# Patient Record
Sex: Male | Born: 1950 | Race: White | Hispanic: No | Marital: Married | Smoking: Former smoker
Health system: Southern US, Community
[De-identification: ages and names within clinical notes are randomized; demographics above are authoritative.]

## PROBLEM LIST (undated history)

## (undated) DIAGNOSIS — M1990 Unspecified osteoarthritis, unspecified site: Secondary | ICD-10-CM

## (undated) DIAGNOSIS — H269 Unspecified cataract: Secondary | ICD-10-CM

## (undated) DIAGNOSIS — K219 Gastro-esophageal reflux disease without esophagitis: Secondary | ICD-10-CM

## (undated) DIAGNOSIS — Z860100 Personal history of colon polyps, unspecified: Secondary | ICD-10-CM

## (undated) DIAGNOSIS — K5790 Diverticulosis of intestine, part unspecified, without perforation or abscess without bleeding: Secondary | ICD-10-CM

## (undated) DIAGNOSIS — M25562 Pain in left knee: Secondary | ICD-10-CM

## (undated) DIAGNOSIS — R7302 Impaired glucose tolerance (oral): Secondary | ICD-10-CM

## (undated) DIAGNOSIS — M2550 Pain in unspecified joint: Secondary | ICD-10-CM

## (undated) DIAGNOSIS — E039 Hypothyroidism, unspecified: Secondary | ICD-10-CM

## (undated) DIAGNOSIS — E669 Obesity, unspecified: Secondary | ICD-10-CM

## (undated) DIAGNOSIS — I517 Cardiomegaly: Secondary | ICD-10-CM

## (undated) DIAGNOSIS — R079 Chest pain, unspecified: Secondary | ICD-10-CM

## (undated) DIAGNOSIS — R3911 Hesitancy of micturition: Secondary | ICD-10-CM

## (undated) DIAGNOSIS — R748 Abnormal levels of other serum enzymes: Secondary | ICD-10-CM

## (undated) DIAGNOSIS — M25561 Pain in right knee: Secondary | ICD-10-CM

## (undated) DIAGNOSIS — K4020 Bilateral inguinal hernia, without obstruction or gangrene, not specified as recurrent: Secondary | ICD-10-CM

## (undated) DIAGNOSIS — G4700 Insomnia, unspecified: Secondary | ICD-10-CM

## (undated) DIAGNOSIS — F419 Anxiety disorder, unspecified: Secondary | ICD-10-CM

## (undated) DIAGNOSIS — F32A Depression, unspecified: Secondary | ICD-10-CM

## (undated) DIAGNOSIS — F329 Major depressive disorder, single episode, unspecified: Secondary | ICD-10-CM

## (undated) DIAGNOSIS — K429 Umbilical hernia without obstruction or gangrene: Secondary | ICD-10-CM

## (undated) DIAGNOSIS — I5189 Other ill-defined heart diseases: Secondary | ICD-10-CM

## (undated) DIAGNOSIS — K760 Fatty (change of) liver, not elsewhere classified: Secondary | ICD-10-CM

## (undated) DIAGNOSIS — R5383 Other fatigue: Secondary | ICD-10-CM

## (undated) DIAGNOSIS — H9319 Tinnitus, unspecified ear: Secondary | ICD-10-CM

## (undated) DIAGNOSIS — G4730 Sleep apnea, unspecified: Secondary | ICD-10-CM

## (undated) DIAGNOSIS — R5381 Other malaise: Secondary | ICD-10-CM

## (undated) DIAGNOSIS — E785 Hyperlipidemia, unspecified: Secondary | ICD-10-CM

## (undated) DIAGNOSIS — I499 Cardiac arrhythmia, unspecified: Secondary | ICD-10-CM

## (undated) HISTORY — DX: Pain in unspecified joint: M25.50

## (undated) HISTORY — DX: Unspecified osteoarthritis, unspecified site: M19.90

## (undated) HISTORY — DX: Impaired glucose tolerance (oral): R73.02

## (undated) HISTORY — DX: Unspecified cataract: H26.9

## (undated) HISTORY — DX: Other fatigue: R53.83

## (undated) HISTORY — DX: Pain in left knee: M25.562

## (undated) HISTORY — DX: Tinnitus, unspecified ear: H93.19

## (undated) HISTORY — DX: Chest pain, unspecified: R07.9

## (undated) HISTORY — DX: Obesity, unspecified: E66.9

## (undated) HISTORY — DX: Personal history of colonic polyps: Z86.010

## (undated) HISTORY — PX: COLON BIOPSY: SHX1369

## (undated) HISTORY — DX: Other fatigue: R53.81

## (undated) HISTORY — PX: OTHER SURGICAL HISTORY: SHX169

## (undated) HISTORY — DX: Depression, unspecified: F32.A

## (undated) HISTORY — DX: Anxiety disorder, unspecified: F41.9

## (undated) HISTORY — PX: POLYPECTOMY: SHX149

## (undated) HISTORY — DX: Hypothyroidism, unspecified: E03.9

## (undated) HISTORY — DX: Abnormal levels of other serum enzymes: R74.8

## (undated) HISTORY — PX: CATARACT EXTRACTION, BILATERAL: SHX1313

## (undated) HISTORY — DX: Major depressive disorder, single episode, unspecified: F32.9

## (undated) HISTORY — DX: Hesitancy of micturition: R39.11

## (undated) HISTORY — DX: Pain in right knee: M25.561

## (undated) HISTORY — DX: Hyperlipidemia, unspecified: E78.5

## (undated) HISTORY — DX: Sleep apnea, unspecified: G47.30

## (undated) HISTORY — DX: Insomnia, unspecified: G47.00

## (undated) HISTORY — DX: Personal history of colon polyps, unspecified: Z86.0100

---

## 2005-10-02 ENCOUNTER — Ambulatory Visit (HOSPITAL_BASED_OUTPATIENT_CLINIC_OR_DEPARTMENT_OTHER): Admission: RE | Admit: 2005-10-02 | Discharge: 2005-10-02 | Payer: Self-pay | Admitting: Emergency Medicine

## 2005-10-06 ENCOUNTER — Ambulatory Visit: Payer: Self-pay | Admitting: Internal Medicine

## 2005-11-27 ENCOUNTER — Ambulatory Visit (HOSPITAL_BASED_OUTPATIENT_CLINIC_OR_DEPARTMENT_OTHER): Admission: RE | Admit: 2005-11-27 | Discharge: 2005-11-27 | Payer: Self-pay | Admitting: Emergency Medicine

## 2005-12-01 ENCOUNTER — Ambulatory Visit: Payer: Self-pay | Admitting: Internal Medicine

## 2006-01-16 ENCOUNTER — Ambulatory Visit: Payer: Self-pay | Admitting: Pulmonary Disease

## 2006-03-06 ENCOUNTER — Ambulatory Visit: Payer: Self-pay | Admitting: Pulmonary Disease

## 2007-11-06 ENCOUNTER — Encounter: Admission: RE | Admit: 2007-11-06 | Discharge: 2007-11-06 | Payer: Self-pay | Admitting: Emergency Medicine

## 2007-11-06 ENCOUNTER — Encounter (INDEPENDENT_AMBULATORY_CARE_PROVIDER_SITE_OTHER): Payer: Self-pay | Admitting: *Deleted

## 2010-08-23 ENCOUNTER — Encounter: Payer: Self-pay | Admitting: Gastroenterology

## 2010-08-28 ENCOUNTER — Telehealth: Payer: Self-pay | Admitting: Gastroenterology

## 2010-08-29 ENCOUNTER — Ambulatory Visit: Payer: Self-pay | Admitting: Gastroenterology

## 2010-08-29 DIAGNOSIS — K5732 Diverticulitis of large intestine without perforation or abscess without bleeding: Secondary | ICD-10-CM | POA: Insufficient documentation

## 2010-08-29 DIAGNOSIS — K921 Melena: Secondary | ICD-10-CM

## 2010-08-29 DIAGNOSIS — K7689 Other specified diseases of liver: Secondary | ICD-10-CM | POA: Insufficient documentation

## 2010-08-29 DIAGNOSIS — R197 Diarrhea, unspecified: Secondary | ICD-10-CM

## 2010-08-30 ENCOUNTER — Telehealth: Payer: Self-pay | Admitting: Gastroenterology

## 2010-09-04 ENCOUNTER — Ambulatory Visit: Payer: Self-pay | Admitting: Gastroenterology

## 2010-09-06 ENCOUNTER — Encounter: Payer: Self-pay | Admitting: Gastroenterology

## 2011-01-31 NOTE — Letter (Signed)
Summary: [facility] Instructions  [facility]  [address] [place], [place] [address]   Phone: [phone]  Fax: [phone]       LATHEN SEAL    10-07-1951    MRN: [id number]        Procedure Day /Date: Tuesday September 6th, 2011     Arrival Time: 2:30pm       Procedure Time: 3:30pm     Location of Procedure:                    _x _  [facility] (4th Floor)                        PREPARATION FOR COLONOSCOPY WITH MOVIPREP   Starting 5 days prior to your procedure 08/30/10 do not eat nuts, seeds, popcorn, corn, beans, peas,  salads, or any raw vegetables.  Do not take any fiber supplements (e.g. Metamucil, Citrucel, and Benefiber).  THE DAY BEFORE YOUR PROCEDURE         DATE: 09/03/10  DAY: Monday  1.  Drink clear liquids the entire day-NO SOLID FOOD  2.  Do not drink anything colored red or purple.  Avoid juices with pulp.  No orange juice.  3.  Drink at least 64 oz. (8 glasses) of fluid/clear liquids during the day to prevent dehydration and help the prep work efficiently.  CLEAR LIQUIDS INCLUDE: Water Jello Ice Popsicles Tea (sugar ok, no milk/cream) Powdered fruit flavored drinks Coffee (sugar ok, no milk/cream) Gatorade Juice: apple, white grape, white cranberry  Lemonade Clear bullion, consomm, broth Carbonated beverages (any kind) Strained chicken noodle soup Hard Candy                             4.  In the morning, mix first dose of MoviPrep solution:    Empty 1 Pouch A and 1 Pouch B into the disposable container    Add lukewarm drinking water to the top line of the container. Mix to dissolve    Refrigerate (mixed solution should be used within 24 hrs)  5.  Begin drinking the prep at 5:00 p.m. The MoviPrep container is divided by 4 marks.   Every 15 minutes drink the solution down to the next mark (approximately 8 oz) until the full liter is complete.   6.  Follow completed prep with 16 oz of clear liquid of your choice  (Nothing red or purple).  Continue to drink clear liquids until bedtime.  7.  Before going to bed, mix second dose of MoviPrep solution:    Empty 1 Pouch A and 1 Pouch B into the disposable container    Add lukewarm drinking water to the top line of the container. Mix to dissolve    Refrigerate  THE DAY OF YOUR PROCEDURE      DATE: 09/04/10 DAY: Tuesday  Beginning at 10:30 a.m. (5 hours before procedure):         1. Every 15 minutes, drink the solution down to the next mark (approx 8 oz) until the full liter is complete.  2. Follow completed prep with 16 oz. of clear liquid of your choice.    3. You may drink clear liquids until 1:30pm (2 HOURS BEFORE PROCEDURE).   MEDICATION INSTRUCTIONS  Unless otherwise instructed, you should take regular prescription medications with a small sip of water   as early as possible the morning  of your procedure.       OTHER INSTRUCTIONS  You will need a responsible adult at least 58 years of age to accompany you and drive you home.   This person must remain in the waiting room during your procedure.  Wear loose fitting clothing that is easily removed.  Leave jewelry and other valuables at home.  However, you may wish to bring a book to read or  an iPod/MP3 player to listen to music as you wait for your procedure to start.  Remove all body piercing jewelry and leave at home.  Total time from sign-in until discharge is approximately 2-3 hours.  You should go home directly after your procedure and rest.  You can resume normal activities the  day after your procedure.  The day of your procedure you should not:   Drive   Make legal decisions   Operate machinery   Drink alcohol   Return to work  You will receive specific instructions about eating, activities and medications before you leave.    The above instructions have been reviewed and explained to me by   Marchelle Folks.     I fully understand and can verbalize these instructions  _____________________________ Date _________

## 2011-01-31 NOTE — Letter (Signed)
[facility]: [facility]   Imported By: Lennie Odor 09/04/2010 12:07:41  _____________________________________________________________________  External Attachment:    Type:   Image     Comment:   External Document

## 2011-01-31 NOTE — Progress Notes (Signed)
Summary: prep not in pharmacy  Phone Note Call from Patient Call back at Home Phone [phone]   Caller: Patient Call For: Dr Russella Dar Reason for Call: Talk to Nurse Summary of Call: Patient states that rx is not in the pharmacy [facility] [address]. Initial call taken by: Tawni Levy,  August 30, 2010 12:00 PM  Follow-up for Phone Call        Rx was sent to pts pharmacy.  Follow-up by: Christie Nottingham CMA Duncan Dull),  August 30, 2010 12:03 PM    New/Updated Medications: MOVIPREP 100 GM  SOLR (PEG-KCL-NACL-NASULF-NA ASC-C) As per prep instructions. Prescriptions: MOVIPREP 100 GM  SOLR (PEG-KCL-NACL-NASULF-NA ASC-C) As per prep instructions.  #1 x 0   Entered by:   Christie Nottingham CMA (AAMA)   Authorized by:   Meryl Dare MD [facility]   Signed by:   Christie Nottingham CMA (AAMA) on 08/30/2010   Method used:   [facility]  [address] [phone][facility] (retail)       [address]       [place], [place]  [id number]       Ph: [phone] or [phone]       Fax: [phone]   RxID:   [id number]

## 2011-01-31 NOTE — Progress Notes (Signed)
Summary: Missing 1 medicine  Phone Note Call from Patient Call back at Home Phone [phone]   Caller: Spouse Call For: Dr Russella Dar Reason for Call: Talk to Nurse Summary of Call: Forgot to call in the antibiotic for the stomach. Only got Moviprep Initial call taken by: Leanor Kail [facility],  August 30, 2010 5:02 PM  Follow-up for Phone Call        Left a message telling pt that the probiotic Dr. Russella Dar wanted him to take is Florastor and it is OTC and for him to call back with any other concerns.  Follow-up by: Christie Nottingham CMA Duncan Dull),  August 31, 2010 8:03 AM

## 2011-01-31 NOTE — Assessment & Plan Note (Signed)
Summary: diarrhea x11 days -antibiotics not working   History of Present Illness Visit Type: consult Primary GI MD: Elie Goody MD [facility] Primary Mairlyn Tegtmeyer: Assunta Curtis, MD  Requesting Emmitt Matthews: na Chief Complaint: Diarrhea, hemorrhoids, and change in bowel habits. Pt tried imodium but did not help the diarrhea  History of Present Illness:   This is a 60 year old male, who relates the acute onset of diarrhea 12 days ago. He states he generally has between one to 2 bowel movements a day beginning acutely. He has had 3-5 loose, watery bowel movements daily. He was prescribed empiric course of Cipro and Flagyl by Dr. Waynard Edwards, with no change in his symptoms.  He notes occasional small amounts of bright red blood on the tissue paper since his diarrhea began.  He relates prior episodes of diverticulitis and underwent a CT scan in 2008 that showed sigmoid colon. Diverticulitis and a fatty liver.   GI Review of Systems      Denies abdominal pain, acid reflux, belching, bloating, chest pain, dysphagia with liquids, dysphagia with solids, heartburn, loss of appetite, nausea, vomiting, vomiting blood, weight loss, and  weight gain.      Reports change in bowel habits, diarrhea, diverticulosis, and  hemorrhoids.     Denies anal fissure, black tarry stools, constipation, fecal incontinence, heme positive stool, irritable bowel syndrome, jaundice, light color stool, liver problems, rectal bleeding, and  rectal pain.   Current Medications (verified): 1)  Fenofibrate 160 Mg Tabs (Fenofibrate) .Marland Kitchen.. 1 By Mouth Once Daily  Allergies (verified): No Known Drug Allergies  Past History:  Past Medical History: Hyperlipedemia Sigmoid Colon Diverticulitis 2008 Depression/anxiety Sleep Apnea Heart Arrhythmia Hemorrhoids Fatty liver on CT 2008  Past Surgical History: Reviewed history from 08/28/2010 and no changes required. no surgical hx  Family History: Family History of Stomach Cancer:PGM    No FH of Colon Cancer:  Social History: Self Employed Married No childern Patient is a former smoker.  Alcohol Use - yes: 3 daily  Daily Caffeine Use: 1 daily Smoking Status:  quit  Review of Systems       The pertinent positives and negatives are noted as above and in the HPI. All other ROS were reviewed and were negative.   Vital Signs:  Patient profile:   60 year old male Height:      70 inches Weight:      255 pounds BMI:     36.72 BSA:     2.32 Pulse rate:   76 / minute Pulse rhythm:   regular BP sitting:   126 / 64  (left arm) Cuff size:   regular  Vitals Entered By: Ok Anis CMA (August 29, 2010 2:58 PM)  Physical Exam  General:  Well developed, well nourished, no acute distress. obese.  obese.   Head:  Normocephalic and atraumatic. Eyes:  PERRLA, no icterus. Ears:  Normal auditory acuity. Mouth:  No deformity or lesions, dentition normal. Neck:  Supple; no masses or thyromegaly. Lungs:  Clear throughout to auscultation. Heart:  Regular rate and rhythm; no murmurs, rubs,  or bruits. Abdomen:  Soft, nontender and nondistended. No masses, hepatosplenomegaly or hernias noted. Normal bowel sounds. Rectal:  Normal exam. hemocult negative.  hemocult negative.   Msk:  Symmetrical with no gross deformities. Normal posture. Pulses:  Normal pulses noted. Extremities:  No clubbing, cyanosis, edema or deformities noted. Neurologic:  Alert and  oriented x4;  grossly normal neurologically. Cervical Nodes:  No significant cervical adenopathy. Inguinal Nodes:  No  significant inguinal adenopathy. Psych:  Alert and cooperative. Normal mood and affect.  Impression & Recommendations:  Problem # 1:  DIARRHEA (ICD-787.91) Acute diarrhea. I suspect an infectious etiology. At this point, it may be postinfectious diarrhea. He is advised to begin a lactose-free, caffeine free, low fat, low alcohol diet. Begin Florsator twice daily. Rule out inflammatory bowel disease, other  types of colitis, and colorectal neoplasms. The risks, benefits and alternatives to colonoscopy with possible biopsy and possible polypectomy were discussed with the patient and they consent to proceed. The procedure will be scheduled electively. Orders: Colonoscopy (Colon)  Problem # 2:  DIVERTICULITIS-COLON (ICD-562.11) Prior history of diverticulitis. Long-term high fiber diet and 6-8 glasses of water intake daily.  Problem # 3:  FATTY LIVER DISEASE (ICD-571.8) Long-term weight loss program with a low-fat diet and moderation of alcohol usage, supervised by PCP.  Problem # 4:  HEMATOCHEZIA (ICD-578.1) Small volume hematochezia associated with diarrhea and illness. Likely hemorrhoid related. Further evaluation with colonoscopy as an problem #1.  Patient Instructions: 1)  Colonoscopy brochure given.  2)  Start Florastor one capsule by mouth two times a day. 3)  Do not have lactose products, milk, caffeine, and minimize use of alcohol.  4)  Copy sent to : Adrian Prince, MD 5)  The medication list was reviewed and reconciled.  All changed / newly prescribed medications were explained.  A complete medication list was provided to the patient / caregiver.

## 2011-01-31 NOTE — Letter (Signed)
Summary: Patient Notice- Polyp Results  [facility]  [address] [place], [place] [address]   Phone: [phone]  Fax: [phone]        September 06, 2010 MRN: [id number]    [facility] [address] [place], [place]  [address]    Dear Mr. Maimone,  I am pleased to inform you that the colon polyp(s) removed during your recent colonoscopy was (were) found to be benign (no cancer detected) upon pathologic examination.  I recommend you have a repeat colonoscopy examination in 5 years to look for recurrent polyps, as having colon polyps increases your risk for having recurrent polyps or even colon cancer in the future.  The colon biopsies showed a resolving colitis.  Should you develop new or worsening symptoms of abdominal pain, bowel habit changes or bleeding from the rectum or bowels, please schedule an evaluation with either your primary care physician or with me.  Continue treatment plan as outlined the day of your exam.  Please call us if you are having persistent problems or have questions about your condition that have not been fully answered at this time.  Sincerely,  Meryl Dare MD [facility]  This letter has been electronically signed by your physician.  Appended Document: Patient Notice- Polyp Results letter mailed

## 2011-01-31 NOTE — Progress Notes (Signed)
Summary: Diarrhea. Appt asap New Pt  Phone Note From Other Clinic   Caller: Malachi Bonds @ [facility] [phone] Call For: Dr Christella Hartigan ( Doc of the Day) Reason for Call: Schedule Patient Appt Summary of Call: Diarrhea x11 days Had one course of antibiotics and it has not been resolved. Would like appt as soon as possible. Initial call taken by: Leanor Kail [facility],  August 28, 2010 1:38 PM  Follow-up for Phone Call        Scheduled with Dr.Stark tomorrow at 3:15 p.m. Malachi Bonds will contact pt. Records given to [facility]. Follow-up by: Teryl Lucy RN,  August 28, 2010 1:57 PM

## 2011-01-31 NOTE — Procedures (Signed)
Summary: Colonoscopy  Patient: Joseph Mccall Note: All result statuses are Final unless otherwise noted.  Tests: (1) Colonoscopy (COL)   COL Colonoscopy           DONE     [facility]     [address]     [place], [place]  [address]           COLONOSCOPY PROCEDURE REPORT     PATIENT:  Joseph Mccall, Joseph Mccall  MR#:  [id number]     BIRTHDATE:  01-10-1951, 59 yrs. old  GENDER:  male     ENDOSCOPIST:  Judie Petit T. Russella Dar, MD, [facility]     Referred by:  Adrian Prince, M.D.     PROCEDURE DATE:  09/04/2010     PROCEDURE:  Colonoscopy with biopsy and snare polypectomy     ASA CLASS:  Class II     INDICATIONS:  1) unexplained diarrhea  2) hematochezia     MEDICATIONS:   Fentanyl 100 mcg IV, Versed 10 mg IV     DESCRIPTION OF PROCEDURE:   After the risks benefits and     alternatives of the procedure were thoroughly explained, informed     consent was obtained.  Digital rectal exam was performed and     revealed no abnormalities.   The LB [id number] endoscope     was introduced through the anus and advanced to the cecum, which     was identified by both the appendix and ileocecal valve, without     limitations.  The quality of the prep was good, using MoviPrep.     The instrument was then slowly withdrawn as the colon was fully     examined.     <<PROCEDUREIMAGES>>     FINDINGS:  A sessile polyp was found in the mid transverse colon.     It was 6 mm in size. Polyp was snared without cautery. Retrieval     was successful.   A sessile polyp was found in the sigmoid colon.     It was 4 mm in size. Polyp was snared without cautery. Retrieval     was successful. Moderate diverticulosis was found in the sigmoid     colon. It was patchy and erythematous. R/O resolving self limted     colitis, prep changes or diverticular colitis. Multiple biopsies     were obtained and sent to pathology.  This was otherwise a normal     examination of the colon. Retroflexed views in the rectum revealed     no  abnormalities. The time to cecum =  5  minutes. The scope was     then withdrawn (time =  9.67  min) from the patient and the     procedure completed.           COMPLICATIONS:  None           ENDOSCOPIC IMPRESSION:     1) 6 mm sessile polyp in the mid transverse colon     2) 4 mm sessile polyp in the sigmoid colon     3) Moderate diverticulosis in the sigmoid colon     RECOMMENDATIONS:     1) High fiber diet with liberal fluid intake.     2) If the polyps removed are adenomatous (pre-cancerous), repeat     colonoscopy in 5 years. Otherwise you should follow colorectal     cancer screening guidelines for "routine risk" patients with     colonoscopy in 10 years.  3) Await pathology           Malcolm T. Russella Dar, MD, [facility]           n.     eSIGNED:   Venita Lick. Stark at 09/04/2010 03:41 PM           Rich Fuchs, [id number]  Note: An exclamation mark (!) indicates a result that was not dispersed into the flowsheet. Document Creation Date: 09/04/2010 3:42 PM _______________________________________________________________________  (1) Order result status: Final Collection or observation date-time: 09/04/2010 15:36 Requested date-time:  Receipt date-time:  Reported date-time:  Referring Physician:   Ordering Physician: Claudette Head [phone]) Specimen Source:  Source: Launa Grill Order Number: [id number] Lab site:   Appended Document: Colonoscopy     Procedures Next Due Date:    Colonoscopy: 08/2015

## 2011-05-17 NOTE — Procedures (Signed)
NAME:  Joseph Mccall, Joseph Mccall               ACCOUNT NO.:  [id number]   MEDICAL RECORD NO.:  [id number]          PATIENT TYPE:  OUT   LOCATION:  SLEEP CENTER                 FACILITY:  [facility]   PHYSICIAN:  Clinton D. Maple Hudson, M.D. DATE OF BIRTH:  [birth year]   DATE OF STUDY:                              NOCTURNAL POLYSOMNOGRAM   REFERRING PHYSICIAN:  Dr. Leslee Home.   DATE OF STUDY:  November 27, 2005.   INDICATION FOR STUDY:  Hypersomnia with sleep apnea.   EPWORTH SLEEPINESS SCORE:  13/24.   BMI:  37.   WEIGHT:  270 pounds. Baseline diagnostic NPSG on October 02, 2005 reported an  AHI of 16.5 per hour. C-PAP titration is requested.   SLEEP ARCHITECTURE:  Total sleep time 321 minutes with sleep efficiency 85%.  Stage I was 7%, stage II 67%, stages III and IV absent, REM 26% of total  sleep time. Sleep latency 22 minutes, REM latency 61 minutes, awake after  sleep onset 20 minutes, arousal index 8.2. The patient took Sonata at  bedtime.   RESPIRATORY DATA:  C-PAP titration protocol:  C-PAP was successfully  titrated to 17 CWP, AHI 0 per hour. A medium ResMed ultra mirage full face  mask was used with heated humidifier.   OXYGEN DATA:  Snoring was prevented and oxygen saturation normal at 93% on  final C-PAP with room air.   CARDIAC DATA:  Normal sinus rhythm.   MOVEMENT/PARASOMNIA:  Occasional leg jerk with insignificant effect on  sleep.   IMPRESSION/RECOMMENDATIONS:  1.  Successful C-PAP titration to 17 CWP, AHI 0 per hour. A medium ResMed      ultra mirage full face mask was      used with heated humidifier. Sonata was taken at bedtime.  2.  Diagnostic baseline NPSG on October 02, 2005 had reported an AHI of 16.5      per hour.      Clinton D. Maple Hudson, M.D.  Diplomate, Biomedical engineer of Sleep Medicine  Electronically Signed     CDY/MEDQ  D:  12/01/2005 11:19:09  T:  12/01/2005 23:08:55  Job:  [id number]

## 2011-05-17 NOTE — Procedures (Signed)
Joseph Mccall, Joseph Mccall               ACCOUNT NO.:  [id number]   MEDICAL RECORD NO.:  [id number]          PATIENT TYPE:  OUT   LOCATION:  SLEEP CENTER                 FACILITY:  [facility]   PHYSICIAN:  Clinton D. Maple Hudson, M.D. DATE OF BIRTH:  1951/07/04   DATE OF STUDY:  10/02/2005                              NOCTURNAL POLYSOMNOGRAM   REFERRING PHYSICIAN:  Dr. Leslee Home.   DATE OF STUDY:  October 02, 2005.   INDICATION FOR STUDY:  Insomnia with sleep apnea. Epworth sleepiness score  14/24, BMI 23. Weight 165 pounds.   SLEEP ARCHITECTURE:  Short total sleep time 200 minutes with sleep  efficiency 46%. Stage I 5%, stage II 76%, stages III and IV 8%, REM 10% of  total sleep time. Sleep latency 74 minutes, REM latency 230 minutes, awake  after sleep onset 162 minutes, arousal index 13. The patient took Valium at  2200 p.m. and also drank two pints of chocolate mild prior to bedtime  stating it would help relax him. He took volume another Valium at 0035 a.m.  along with a cup of whole milk. Sleep onset was not sustained until nearly 2  a.m.   RESPIRATORY DATA:  Apnea/hypopnea index (AHI, RDI) 16.5 obstructive events  per hour indicating mild to moderate obstructive sleep apnea/hypopnea  syndrome. There were 51 obstructive apneas and 4 hypopneas. Events were  strongly positional, most while supine (supine AHI 67.1 per hour). REM AHI  101 per hour. He was unable to accumulate sleep or events sufficient to  permit use of C-PAP titration by protocol on this study night.   OXYGEN DATA:  Moderate snoring with oxygen desaturation to a nadir of 78%.  Mean oxygen saturation during the study was 94% on room air.   CARDIAC DATA:  Normal sinus rhythm.   MOVEMENTS/PARASOMNIA:  Occasional leg jerk, insignificant. Bathroom x2.   IMPRESSION:  1.  Significant difficulty initiating and maintaining sleep despite Valium      and milk as noted above.  2.  Mild to moderate obstructive sleep apnea/hypopnea  syndrome, AHI 16.5 per      hour with moderate snoring and oxygen desaturation to 78%. Events were      significantly associated with supine posture and REM sleep. He may      benefit from sleeping off the flat of his back. He would qualify to      return for C-PAP titration. If doing so he should bring a sleep      medication with him.      Clinton D. Maple Hudson, M.D.  Diplomate, Biomedical engineer of Sleep Medicine  Electronically Signed     CDY/MEDQ  D:  10/06/2005 13:48:24  T:  10/06/2005 23:45:49  Job:  [id number]

## 2012-03-24 ENCOUNTER — Institutional Professional Consult (permissible substitution): Payer: Self-pay | Admitting: Pulmonary Disease

## 2012-04-28 ENCOUNTER — Institutional Professional Consult (permissible substitution): Payer: Self-pay | Admitting: Pulmonary Disease

## 2013-11-11 ENCOUNTER — Institutional Professional Consult (permissible substitution): Payer: Self-pay | Admitting: Neurology

## 2014-09-28 ENCOUNTER — Encounter: Payer: Self-pay | Admitting: Gastroenterology

## 2014-11-08 ENCOUNTER — Encounter: Payer: Self-pay | Admitting: Gastroenterology

## 2015-01-17 ENCOUNTER — Encounter: Payer: Self-pay | Admitting: Gastroenterology

## 2015-08-04 ENCOUNTER — Encounter: Payer: Self-pay | Admitting: Gastroenterology

## 2015-08-30 ENCOUNTER — Encounter (INDEPENDENT_AMBULATORY_CARE_PROVIDER_SITE_OTHER): Payer: Self-pay

## 2015-08-30 ENCOUNTER — Ambulatory Visit (INDEPENDENT_AMBULATORY_CARE_PROVIDER_SITE_OTHER): Payer: 59 | Admitting: Neurology

## 2015-08-30 ENCOUNTER — Encounter: Payer: Self-pay | Admitting: Neurology

## 2015-08-30 VITALS — BP 128/70 | HR 76 | Resp 20 | Ht 69.0 in | Wt 273.0 lb

## 2015-08-30 DIAGNOSIS — F413 Other mixed anxiety disorders: Secondary | ICD-10-CM | POA: Diagnosis not present

## 2015-08-30 DIAGNOSIS — G4700 Insomnia, unspecified: Secondary | ICD-10-CM | POA: Diagnosis not present

## 2015-08-30 DIAGNOSIS — F40240 Claustrophobia: Secondary | ICD-10-CM | POA: Diagnosis not present

## 2015-08-30 DIAGNOSIS — G4730 Sleep apnea, unspecified: Secondary | ICD-10-CM

## 2015-08-30 DIAGNOSIS — G4733 Obstructive sleep apnea (adult) (pediatric): Secondary | ICD-10-CM

## 2015-08-30 MED ORDER — ZALEPLON 10 MG PO CAPS: 10.0000 mg | ORAL_CAPSULE | Freq: Every evening | ORAL | Status: DC | PRN

## 2015-08-30 NOTE — Patient Instructions (Signed)
Insomnia Insomnia is frequent trouble falling and/or staying asleep. Insomnia can be a long term problem or a short term problem. Both are common. Insomnia can be a short term problem when the wakefulness is related to a certain stress or worry. Long term insomnia is often related to ongoing stress during waking hours and/or poor sleeping habits. Overtime, sleep deprivation itself can make the problem worse. Every little thing feels more severe because you are overtired and your ability to cope is decreased. CAUSES   Stress, anxiety, and depression.  Poor sleeping habits.  Distractions such as TV in the bedroom.  Naps close to bedtime.  Engaging in emotionally charged conversations before bed.  Technical reading before sleep.  Alcohol and other sedatives. They may make the problem worse. They can hurt normal sleep patterns and normal dream activity.  Stimulants such as caffeine for several hours prior to bedtime.  Pain syndromes and shortness of breath can cause insomnia.  Exercise late at night.  Changing time zones may cause sleeping problems (jet lag). It is sometimes helpful to have someone observe your sleeping patterns. They should look for periods of not breathing during the night (sleep apnea). They should also look to see how long those periods last. If you live alone or observers are uncertain, you can also be observed at a sleep clinic where your sleep patterns will be professionally monitored. Sleep apnea requires a checkup and treatment. Give your caregivers your medical history. Give your caregivers observations your family has made about your sleep.  SYMPTOMS   Not feeling rested in the morning.  Anxiety and restlessness at bedtime.  Difficulty falling and staying asleep. TREATMENT   Your caregiver may prescribe treatment for an underlying medical disorders. Your caregiver can give advice or help if you are using alcohol or other drugs for self-medication. Treatment  of underlying problems will usually eliminate insomnia problems.  Medications can be prescribed for short time use. They are generally not recommended for lengthy use.  Over-the-counter sleep medicines are not recommended for lengthy use. They can be habit forming.  You can promote easier sleeping by making lifestyle changes such as:  Using relaxation techniques that help with breathing and reduce muscle tension.  Exercising earlier in the day.  Changing your diet and the time of your last meal. No night time snacks.  Establish a regular time to go to bed.  Counseling can help with stressful problems and worry.  Soothing music and white noise may be helpful if there are background noises you cannot remove.  Stop tedious detailed work at least one hour before bedtime. HOME CARE INSTRUCTIONS   Keep a diary. Inform your caregiver about your progress. This includes any medication side effects. See your caregiver regularly. Take note of:  Times when you are asleep.  Times when you are awake during the night.  The quality of your sleep.  How you feel the next day. This information will help your caregiver care for you.  Get out of bed if you are still awake after 15 minutes. Read or do some quiet activity. Keep the lights down. Wait until you feel sleepy and go back to bed.  Keep regular sleeping and waking hours. Avoid naps.  Exercise regularly.  Avoid distractions at bedtime. Distractions include watching television or engaging in any intense or detailed activity like attempting to balance the household checkbook.  Develop a bedtime ritual. Keep a familiar routine of bathing, brushing your teeth, climbing into bed at the same   time each night, listening to soothing music. Routines increase the success of falling to sleep faster.  Use relaxation techniques. This can be using breathing and muscle tension release routines. It can also include visualizing peaceful scenes. You can  also help control troubling or intruding thoughts by keeping your mind occupied with boring or repetitive thoughts like the old concept of counting sheep. You can make it more creative like imagining planting one beautiful flower after another in your backyard garden.  During your day, work to eliminate stress. When this is not possible use some of the previous suggestions to help reduce the anxiety that accompanies stressful situations. MAKE SURE YOU:   Understand these instructions.  Will watch your condition.  Will get help right away if you are not doing well or get worse. Document Released: 12/13/2000 Document Revised: 03/09/2012 Document Reviewed: 01/13/2008 [facility] Patient Information 2015 [place], [place]. This information is not intended to replace advice given to you by your health care provider. Make sure you discuss any questions you have with your health care provider. Generalized Anxiety Disorder Generalized anxiety disorder (GAD) is a mental disorder. It interferes with life functions, including relationships, work, and school. GAD is different from normal anxiety, which everyone experiences at some point in their lives in response to specific life events and activities. Normal anxiety actually helps [place] prepare for and get through these life events and activities. Normal anxiety goes away after the event or activity is over.  GAD causes anxiety that is not necessarily related to specific events or activities. It also causes excess anxiety in proportion to specific events or activities. The anxiety associated with GAD is also difficult to control. GAD can vary from mild to severe. People with severe GAD can have intense waves of anxiety with physical symptoms (panic attacks).  SYMPTOMS The anxiety and worry associated with GAD are difficult to control. This anxiety and worry are related to many life events and activities and also occur more days than not for 6 months or longer. People  with GAD also have three or more of the following symptoms (one or more in children):  Restlessness.   Fatigue.  Difficulty concentrating.   Irritability.  Muscle tension.  Difficulty sleeping or unsatisfying sleep. DIAGNOSIS GAD is diagnosed through an assessment by your health care provider. Your health care provider will ask you questions aboutyour mood,physical symptoms, and events in your life. Your health care provider may ask you about your medical history and use of alcohol or drugs, including prescription medicines. Your health care provider may also do a physical exam and blood tests. Certain medical conditions and the use of certain substances can cause symptoms similar to those associated with GAD. Your health care provider may refer you to a mental health specialist for further evaluation. TREATMENT The following therapies are usually used to treat GAD:   Medication. Antidepressant medication usually is prescribed for long-term daily control. Antianxiety medicines may be added in severe cases, especially when panic attacks occur.   Talk therapy (psychotherapy). Certain types of talk therapy can be helpful in treating GAD by providing support, education, and guidance. A form of talk therapy called cognitive behavioral therapy can teach you healthy ways to think about and react to daily life events and activities.  Stress managementtechniques. These include yoga, meditation, and exercise and can be very helpful when they are practiced regularly. A mental health specialist can help determine which treatment is best for you. Some people see improvement with one therapy.  However, other people require a combination of therapies. Document Released: 04/12/2013 Document Revised: 05/02/2014 Document Reviewed: 04/12/2013 [facility] Patient Information 2015 [place], [place]. This information is not intended to replace advice given to you by your health care provider. Make sure you  discuss any questions you have with your health care provider.

## 2015-08-30 NOTE — Progress Notes (Signed)
SLEEP MEDICINE CLINIC   Provider:  Larey Seat, M D  Referring Provider: Reynold Bowen, MD Primary Care Physician:  Sheela Stack, MD  Chief Complaint  Patient presents with  . sleep consult    snoring, pt has an shingles outbreak, started 2 weeks ago, rm 11, alone   Chief complaint according to patient : "insomnia, Poor sleep quality, apnea."  HPI:  Joseph Mccall is a 64 y.o. male , seen here as a referral from Dr. Forde Dandy for a sleep consultation,  Joseph Mccall is referred here by Dr. Reynold Bowen, who would like him to be evaluated for organic reasons of insomnia.  The patient reported to his primary care physician that he was snoring loudly and that he frequently woke up at night. He also has trouble going back to sleep once awoken. In daytime he feels often fatigued and excessively sleepy and he mentioned that he may have apnea. Family members especially his spouse that she has a bedroom with him has witnessed apneas. The patient estimates that he is snoring and may have apnea for a long as long as 20 years. The patient underwent a sleep study about 2006. At the time he had a sleep efficiency of 85%, sleep latency of 22 minutes, REM latency 61 minutes. He took Sonata at bedtime he had an AHI of 16.5 per hour was moderate snoring and an oxygen nadir of 78%. In supine position and during REM sleep his apnea was exacerbated.  He was asked to return for CPAP titration at the time. In 2008 he was diagnosed with fatty liver and sigmoid diverticulitis in 2010 with left ventricular hypertrophy by cardiac echo. He takes valium, for anxiety.   Sleep habits are as follows: He goes to bed between 11 PM and 1 AM or thereabouts. His main problem is the initiation of sleep he will need sometimes an hour to fall asleep or even more. If he wakes up from sleep in the night he usually doesn't need quite as long to return to sleep again. The patient gained weight over the last years steadily  until he seemed to have plateaued over the last 2-3 years. At this time he is still considered morbidly obese. He usually is not bothered by nocturia he may get up once but not more at night. He relies on an alarm to wake up in the morning at about 8 AM. He does not consume any coffee in the morning. He will get between 7 and 9 hours of sleep at night but he also will take naps in daytime. Naps will last an hour to 90 minutes at the most. He will go to bed for his nap time, too. His wife has witnessed him to snore and to have apnea. Until 5 perhaps 6 years ago he used to CPAP but he couldn't use it very long. He reported that he was not able to sleep with it and it didn't work out. He has tried to treat his insomnia with melatonin and Valium intermittently.    Sleep medical history and family sleep history: No family history of OSA  Social history: married, former smoker - quit 1992 , ETOH - regular , beer 2 drinks/day/   Review of Systems: Out of a complete 14 system review, the patient complains of only the following symptoms, and all other reviewed systems are negative. Weight gain , abdominal distention, snoring. A witnessed apnea, EDS/   Epworth score  10  , Fatigue severity score 59  ,  depression score 4    Social History   Social History  . Marital Status: Married    Spouse Name: N/A  . Number of Children: N/A  . Years of Education: N/A   Occupational History  . Not on file.   Social History Main Topics  . Smoking status: Former Smoker    Quit date: 12/30/1988  . Smokeless tobacco: Not on file  . Alcohol Use: 12.6 oz/week    21 Standard drinks or equivalent per week  . Drug Use: No  . Sexual Activity: Not on file   Other Topics Concern  . Not on file   Social History Narrative   Denies caffeine consumption.    Family History  Problem Relation Age of Onset  . Lung disease Father     Past Medical History  Diagnosis Date  . Chest pain   . Obesity   . Arthralgia    . Knee pain, bilateral   . Tinnitus   . Glucose intolerance (impaired glucose tolerance)   . Abnormal transaminases   . History of colonic polyps   . Malaise and fatigue   . Hypothyroidism   . Urinary hesitancy   . Insomnia   . Anxiety and depression   . Sleep apnea   . Hyperlipidemia     Past Surgical History  Procedure Laterality Date  . Colon polyp removal    . Colon biopsy      Current Outpatient Prescriptions  Medication Sig Dispense Refill  . aspirin 81 MG tablet Take 81 mg by mouth daily.    . Ciclopirox (LOPROX) 1 % shampoo Apply topically at bedtime.    . clobetasol cream (TEMOVATE) 8.33 % Apply 1 application topically 2 (two) times daily.    . diazepam (VALIUM) 5 MG tablet Take 5 mg by mouth every 6 (six) hours as needed for anxiety.    . fenofibrate 160 MG tablet Take 160 mg by mouth daily.    Marland Kitchen gabapentin (NEURONTIN) 300 MG capsule TAKE ONE CAPSULE BY MOUTH UP TO 3 TIMES A DAY FOR PAIN  0  . HYDROcodone-acetaminophen (NORCO/VICODIN) 5-325 MG per tablet TAKE 1 TABLET BY MOUTH AT BEDTIME AS NEEDED FOR PAIN (SEVERE)  0  . ketoconazole (NIZORAL) 200 MG tablet Take 200 mg by mouth 2 (two) times daily.    Marland Kitchen levothyroxine (SYNTHROID, LEVOTHROID) 112 MCG tablet Take 112 mcg by mouth daily before breakfast.    . Naltrexone-Bupropion HCl ER (CONTRAVE) 8-90 MG TB12 Take by mouth.    . omega-3 acid ethyl esters (LOVAZA) 1 G capsule Take by mouth 2 (two) times daily.    . valACYclovir (VALTREX) 1000 MG tablet TAKE 1 TABLET BY MOUTH 3 TIMES A DAY FOR 7 DAYS  0   No current facility-administered medications for this visit.    Allergies as of 08/30/2015  . (No Known Allergies)    Vitals: BP 128/70 mmHg  Pulse 76  Resp 20  Ht 5\' 9"  (1.753 m)  Wt 273 lb (123.832 kg)  BMI 40.30 kg/m2 Last Weight:  Wt Readings from Last 1 Encounters:  08/30/15 273 lb (123.832 kg)   ASN:KNLZ mass index is 40.3 kg/(m^2).     Last Height:   Ht Readings from Last 1 Encounters:  08/30/15  5\' 9"  (1.753 m)    Physical exam:  General: The patient is awake, alert and appears not in acute distress. The patient is well groomed. Head: Normocephalic, atraumatic. Neck is supple. Mallampati 4   neck circumference: 18. Nasal  airflow not restricted TMJ is not  evident . Retrognathia is not seen. No bruxism.  Cardiovascular:  Regular rate and rhythm , without  murmurs or carotid bruit, and without distended neck veins. Respiratory: Lungs are clear to auscultation. Skin:  Without evidence of edema, or rash Trunk: BMI is severely obese.  Neurologic exam : The patient is awake and alert, oriented to place and time.   Memory subjective described as intact.      Attention span & concentration ability appears normal.  Speech is fluent, without dysarthria, dysphonia or aphasia.  Mood and affect are appropriate.  Cranial nerves: Pupils are equal and briskly reactive to light.  Funduscopic exam without  evidence of pallor or edema.  Extraocular movements  in vertical and horizontal planes intact and without nystagmus. Visual fields by finger perimetry are intact. Hearing to finger rub intact.   Facial sensation intact to fine touch.  Facial motor strength is symmetric and tongue and uvula move midline. Shoulder shrug was symmetrical.   Motor exam: Normal tone, muscle bulk and symmetric strength in all extremities.  Sensory:  Fine touch, pinprick and vibration were tested in all extremities.  Proprioception tested in the upper extremities was normal.  Coordination: Rapid alternating movements in the fingers/hands was normal.  Finger-to-nose maneuver  normal without evidence of ataxia, dysmetria or tremor.  Gait and station: Patient walks without assistive device and is able unassisted to climb up to the exam table. Strength within normal limits.  Stance is stable and normal.  Toe and hell stand were tested .Tandem gait is unfragmented. Turns with  4-5  Steps.  His steps are shorter  and he complains of knee pain. Romberg testing is negative.  Deep tendon reflexes: in the  upper and lower extremities are symmetric and intact. Babinski maneuver response is  downgoing.  The patient was advised of the nature of the diagnosed sleep disorder , the treatment options and risks for general a health and wellness arising from not treating the condition.  I spent more than 50 minutes of face to face time with the patient. Greater than 50% of time was spent in counseling and coordination of care. We have discussed the diagnosis and differential and I answered the patient's questions.     Assessment:  After physical and neurologic examination, review of laboratory studies,  Personal review of imaging studies, reports of other /same  Imaging studies , I Reviewed the Results of polysomnography/ neurophysiology testing and pre-existing records as far as provided in visit., my assessment is;   1) I suspect that Joseph Mccall still has obstructive sleep apnea, partially because his body mass index has increased since 2006 and he has aged which affects his muscle tone. That he could not tolerate CPAP well may be explained with his anxiety disorder and claustrophobia. The interfaces be used now for CPAP a much different from a decade ago. The machine's themselves are smaller very quiet and the use often nasal pillows instead of full face masks. Joseph Mccall also has facial hair which makes the use of a full face mask not advisable.  2) is his main risk factor is obesity , this causes his flat breathing , pauses in breathing and the witnessed loud snoring. Helpful would be the elimination or curving down of alcohol intake as it is high in calories and burdens his liver. To establish an exercise program that does not hurt his knees any further would also be advisable. This could be a stationary bicycle  a rowing machine or NordicTrack or aqua gymnastics.  3) insomnia, suspected related to sleep apnea as well  as anxiety, and some improvement in sleep hygiene may help to shorten his sleep latency. To advance a bedtime between 10 and 11 PM instead of after midnight tends to produce more sound sleep and more REM sleep wich is perceived as the most refreshing and restorative sleep. Sleep hygiene and insonnia booklet given .     Plan:  Treatment plan and additional workup :  Given the patients higher degree of apprehension and history of insomnia I would prefer to screening in a home sleep test. If apnea is found to a mild degree and he is mainly snoring loudly a dental device could be used instead of CPAP. If the apneas moderate or severe or associated with oxygen desaturation he will likely need CPAP. In that case I would like to use a nasal pillow mask for titration the smallest possible interface. He does not wake up with headaches and he does not have nocturia and I feel confident that we do not need to measure CO2.   Rv after sleep study Joseph Partridge Lonald Troiani MD  08/30/2015   CC: Reynold Bowen, [address] [place],  [place] [id number]

## 2015-09-07 ENCOUNTER — Telehealth: Payer: Self-pay | Admitting: Neurology

## 2015-09-07 DIAGNOSIS — F40240 Claustrophobia: Secondary | ICD-10-CM

## 2015-09-07 DIAGNOSIS — G4700 Insomnia, unspecified: Secondary | ICD-10-CM

## 2015-09-07 DIAGNOSIS — F413 Other mixed anxiety disorders: Secondary | ICD-10-CM

## 2015-09-07 DIAGNOSIS — G4730 Sleep apnea, unspecified: Secondary | ICD-10-CM

## 2015-09-07 DIAGNOSIS — G4733 Obstructive sleep apnea (adult) (pediatric): Secondary | ICD-10-CM

## 2015-09-07 NOTE — Telephone Encounter (Signed)
Order has been placed.

## 2015-09-07 NOTE — Telephone Encounter (Signed)
I need an order for the home sleep study listed in the notes for this patient.  please

## 2015-09-19 ENCOUNTER — Telehealth: Payer: Self-pay | Admitting: Neurology

## 2015-09-19 DIAGNOSIS — G4730 Sleep apnea, unspecified: Secondary | ICD-10-CM

## 2015-09-19 DIAGNOSIS — F413 Other mixed anxiety disorders: Secondary | ICD-10-CM

## 2015-09-19 DIAGNOSIS — F40240 Claustrophobia: Secondary | ICD-10-CM

## 2015-09-19 DIAGNOSIS — G4700 Insomnia, unspecified: Secondary | ICD-10-CM

## 2015-09-19 NOTE — Telephone Encounter (Signed)
Dr. Brett Fairy patient

## 2015-09-19 NOTE — Telephone Encounter (Signed)
Patient called stating he was to get Sonata for home sleep study. Please call and advise. Patient can be reached at [phone].

## 2015-09-20 ENCOUNTER — Encounter: Payer: Self-pay | Admitting: Gastroenterology

## 2015-09-26 MED ORDER — ZALEPLON 10 MG PO CAPS: 10.0000 mg | ORAL_CAPSULE | Freq: Every evening | ORAL | Status: DC | PRN

## 2015-09-26 NOTE — Telephone Encounter (Signed)
Called pt to ask if he needs sonata still. No answer, left a message on his cell VM per DPR asking if he has sonata and to please call me back.

## 2015-09-26 NOTE — Telephone Encounter (Signed)
Spoke to Dr. Brett Fairy, she asks me to refill pt's sonata so he may have some on hand if he needs it for sleep study. Waiting on pt to call back so I can advise him.

## 2015-09-27 ENCOUNTER — Encounter (INDEPENDENT_AMBULATORY_CARE_PROVIDER_SITE_OTHER): Payer: 59 | Admitting: Neurology

## 2015-09-27 DIAGNOSIS — F413 Other mixed anxiety disorders: Secondary | ICD-10-CM

## 2015-09-27 DIAGNOSIS — G4733 Obstructive sleep apnea (adult) (pediatric): Secondary | ICD-10-CM | POA: Diagnosis not present

## 2015-09-27 DIAGNOSIS — G4730 Sleep apnea, unspecified: Principal | ICD-10-CM

## 2015-09-27 DIAGNOSIS — G4700 Insomnia, unspecified: Secondary | ICD-10-CM

## 2015-09-27 DIAGNOSIS — F40240 Claustrophobia: Secondary | ICD-10-CM

## 2015-10-03 ENCOUNTER — Telehealth: Payer: Self-pay

## 2015-10-03 NOTE — Telephone Encounter (Signed)
I called both numbers listed on demographics for pt, but both number are saying that the numbers have been disconnected. Will mail a letter to the patient asking him to call our office for sleep study results.

## 2015-10-03 NOTE — Telephone Encounter (Signed)
Left message on number listed asking pt to call me back.

## 2015-10-04 NOTE — Telephone Encounter (Signed)
Spoke to pt and gave him sleep study results. Advised pt that his stud revealed such mild osa and snoring that treatment will be directed towards the snoring. PAP therapy is not indicated but Dr. Brett Fairy advises pt to proceed with an oral appliance or ENT evaluation/procedure. I advised weight loss and positional therapy. Pt says he would like to think about the oral appliance and do some research and he will call us back if/when he decides he would like a referral to a dentist to have it done.

## 2015-11-01 ENCOUNTER — Ambulatory Visit (AMBULATORY_SURGERY_CENTER): Payer: Self-pay | Admitting: *Deleted

## 2015-11-01 VITALS — Ht 69.0 in | Wt 272.0 lb

## 2015-11-01 DIAGNOSIS — Z86010 Personal history of colonic polyps: Secondary | ICD-10-CM

## 2015-11-01 MED ORDER — NA SULFATE-K SULFATE-MG SULF 17.5-3.13-1.6 GM/177ML PO SOLN: 1.0000 | Freq: Once | ORAL | Status: DC

## 2015-11-01 NOTE — Progress Notes (Signed)
No egg or soy allergy No issues with past sedation No diet pills No home 02 use emmi video declined  

## 2015-11-15 ENCOUNTER — Encounter: Payer: Self-pay | Admitting: Gastroenterology

## 2015-11-21 ENCOUNTER — Telehealth (HOSPITAL_COMMUNITY): Payer: Self-pay | Admitting: *Deleted

## 2015-11-29 ENCOUNTER — Telehealth (HOSPITAL_COMMUNITY): Payer: Self-pay | Admitting: *Deleted

## 2015-11-30 ENCOUNTER — Other Ambulatory Visit (HOSPITAL_COMMUNITY): Payer: Self-pay | Admitting: Endocrinology

## 2015-11-30 DIAGNOSIS — I517 Cardiomegaly: Secondary | ICD-10-CM

## 2015-12-07 ENCOUNTER — Encounter: Payer: Self-pay | Admitting: Gastroenterology

## 2015-12-12 ENCOUNTER — Other Ambulatory Visit: Payer: Self-pay

## 2015-12-12 ENCOUNTER — Ambulatory Visit (HOSPITAL_COMMUNITY): Payer: 59 | Attending: Cardiology

## 2015-12-12 DIAGNOSIS — I340 Nonrheumatic mitral (valve) insufficiency: Secondary | ICD-10-CM | POA: Insufficient documentation

## 2015-12-12 DIAGNOSIS — Z6841 Body Mass Index (BMI) 40.0 and over, adult: Secondary | ICD-10-CM | POA: Diagnosis not present

## 2015-12-12 DIAGNOSIS — I77810 Thoracic aortic ectasia: Secondary | ICD-10-CM | POA: Insufficient documentation

## 2015-12-12 DIAGNOSIS — I517 Cardiomegaly: Secondary | ICD-10-CM | POA: Insufficient documentation

## 2015-12-12 DIAGNOSIS — I5189 Other ill-defined heart diseases: Secondary | ICD-10-CM

## 2015-12-12 HISTORY — DX: Other ill-defined heart diseases: I51.89

## 2015-12-12 HISTORY — DX: Cardiomegaly: I51.7

## 2015-12-19 ENCOUNTER — Telehealth: Payer: Self-pay | Admitting: Gastroenterology

## 2015-12-20 ENCOUNTER — Encounter: Payer: Self-pay | Admitting: Gastroenterology

## 2016-02-01 ENCOUNTER — Ambulatory Visit (AMBULATORY_SURGERY_CENTER): Payer: Self-pay

## 2016-02-01 VITALS — Ht 68.5 in | Wt 273.0 lb

## 2016-02-01 DIAGNOSIS — Z860100 Personal history of colon polyps, unspecified: Secondary | ICD-10-CM

## 2016-02-01 NOTE — Progress Notes (Signed)
No allergies to eggs or soy No past problems with anesthesia No home oxygen No diet/weight loss meds  Has email and internet; refused emmi 

## 2016-02-08 ENCOUNTER — Other Ambulatory Visit: Payer: Self-pay | Admitting: Endocrinology

## 2016-02-08 DIAGNOSIS — R1084 Generalized abdominal pain: Secondary | ICD-10-CM

## 2016-02-14 ENCOUNTER — Ambulatory Visit
Admission: RE | Admit: 2016-02-14 | Discharge: 2016-02-14 | Disposition: A | Discharge status: Home / Self Care | Payer: 59 | Source: Ambulatory Visit | Attending: Endocrinology | Admitting: Endocrinology

## 2016-02-14 DIAGNOSIS — R1084 Generalized abdominal pain: Secondary | ICD-10-CM

## 2016-02-14 MED ORDER — IOPAMIDOL (ISOVUE-300) INJECTION 61%
125.0000 mL | Freq: Once | INTRAVENOUS | Status: AC | PRN
  Administered 2016-02-14: 125 mL via INTRAVENOUS

## 2016-02-20 ENCOUNTER — Telehealth: Payer: Self-pay | Admitting: Gastroenterology

## 2016-02-20 NOTE — Telephone Encounter (Signed)
Pt wants to discuss sedation.  Procedure is tomorrow.   Best contact # [phone]

## 2016-02-20 NOTE — Telephone Encounter (Signed)
Spoke with patient about sedation , and what we use for sedation.  He wants to have fentanyl and versed since that is what he had last procedure. He had 100 fentanyl and 10 versed.  Informed pt that for the last sedation he had a lot of medicine and that about 3 years ago we changed to propofol across the board with all MD'S.   Informed him he may discuss this with his Md adn CRNA prior to his procedure. He stated he did not want general anesthesia, instructed him that this is not general anesthesia but deep sedation.  Pt did stated he should have his preference of sedation.  Informed pt of propofol , what it is, recovery better, easier procedure. He states he will discuss tomorrow with md.  Lenard Galloway RN

## 2016-02-21 ENCOUNTER — Ambulatory Visit (AMBULATORY_SURGERY_CENTER): Payer: 59 | Admitting: Gastroenterology

## 2016-02-21 ENCOUNTER — Encounter: Payer: Self-pay | Admitting: Gastroenterology

## 2016-02-21 VITALS — BP 143/76 | HR 64 | Temp 98.9°F | Resp 14 | Ht 69.0 in | Wt 273.0 lb

## 2016-02-21 DIAGNOSIS — Z86010 Personal history of colonic polyps: Secondary | ICD-10-CM | POA: Diagnosis present

## 2016-02-21 MED ORDER — SODIUM CHLORIDE 0.9 % IV SOLN: 500.0000 mL | INTRAVENOUS | Status: DC

## 2016-02-21 NOTE — Op Note (Signed)
[place]  [address]. [place], [id number]   COLONOSCOPY PROCEDURE REPORT  PATIENT: Joseph, Mccall  MR#: [id number] BIRTHDATE: May 18, 1951 , 59  yrs. old GENDER: male ENDOSCOPIST: Ladene Artist, MD, Marval Regal REFERRED BY:  Shon Baton, M.D. PROCEDURE DATE:  02/21/2016 PROCEDURE:   Colonoscopy, surveillance First Screening Colonoscopy - Avg.  risk and is 50 yrs.  old or older - No.  Prior Negative Screening - Now for repeat screening. N/A  History of Adenoma - Now for follow-up colonoscopy & has been > or = to 3 yrs.  Yes hx of adenoma.  Has been 3 or more years since last colonoscopy.  Polyps removed today? No Recommend repeat exam, <10 yrs? Yes high risk ASA CLASS:   Class II INDICATIONS:Surveillance due to prior colonic neoplasia and PH Colon Adenoma. MEDICATIONS: Monitored anesthesia care and Propofol 300 mg IV  DESCRIPTION OF PROCEDURE:   After the risks benefits and alternatives of the procedure were thoroughly explained, informed consent was obtained.  The digital rectal exam revealed no abnormalities of the rectum.   The LB [id number]  endoscope was introduced through the anus and advanced to the cecum, which was identified by both the appendix and ileocecal valve. No adverse events experienced.   The quality of the prep was excellent. (Suprep was used)  The instrument was then slowly withdrawn as the colon was fully examined. Estimated blood loss is zero unless otherwise noted in this procedure report.    COLON FINDINGS: There was moderate diverticulosis noted in the sigmoid colon with associated colonic narrowing and muscular hypertrophy.   The colonic mucosa appeared normal at the splenic flexure, in the transverse colon, rectum, descending colon, at the ileocecal valve, cecum, hepatic flexure, and in the ascending colon.  Retroflexed views revealed internal Grade I hemorrhoids. The time to cecum = 1.4 Withdrawal time = 12.5   The scope  was withdrawn and the procedure completed. COMPLICATIONS: There were no immediate complications.  ENDOSCOPIC IMPRESSION: 1.   Moderate diverticulosis in the sigmoid colon 2.   Grade I internal hemorrhoids  RECOMMENDATIONS: 1.  High fiber diet with liberal fluid intake. 2.  Repeat Colonoscopy in 5 years.  eSigned:  Ladene Artist, MD, [facility] 02/21/2016 2:47 PM

## 2016-02-21 NOTE — Patient Instructions (Signed)
YOU HAD AN ENDOSCOPIC PROCEDURE TODAY AT [facility]:   Refer to the procedure report that was given to you for any specific questions about what was found during the examination.  If the procedure report does not answer your questions, please call your gastroenterologist to clarify.  If you requested that your care partner not be given the details of your procedure findings, then the procedure report has been included in a sealed envelope for you to review at your convenience later.  YOU SHOULD EXPECT: Some feelings of bloating in the abdomen. Passage of more gas than usual.  Walking can help get rid of the air that was put into your GI tract during the procedure and reduce the bloating. If you had a lower endoscopy (such as a colonoscopy or flexible sigmoidoscopy) you may notice spotting of blood in your stool or on the toilet paper. If you underwent a bowel prep for your procedure, you may not have a normal bowel movement for a few days.  Please Note:  You might notice some irritation and congestion in your nose or some drainage.  This is from the oxygen used during your procedure.  There is no need for concern and it should clear up in a day or so.  SYMPTOMS TO REPORT IMMEDIATELY:   Following lower endoscopy (colonoscopy or flexible sigmoidoscopy):  Excessive amounts of blood in the stool  Significant tenderness or worsening of abdominal pains  Swelling of the abdomen that is new, acute  Fever of 100F or higher  For urgent or emergent issues, a gastroenterologist can be reached at any hour by calling [phone].   DIET: Your first meal following the procedure should be a small meal and then it is ok to progress to your normal diet. Heavy or fried foods are harder to digest and may make you feel nauseous or bloated.  Likewise, meals heavy in dairy and vegetables can increase bloating.  Drink plenty of fluids but you should avoid alcoholic beverages for 24  hours.  ACTIVITY:  You should plan to take it easy for the rest of today and you should NOT DRIVE or use heavy machinery until tomorrow (because of the sedation medicines used during the test).    FOLLOW UP: Our staff will call the number listed on your records the next business day following your procedure to check on you and address any questions or concerns that you may have regarding the information given to you following your procedure. If we do not reach you, we will leave a message.  However, if you are feeling well and you are not experiencing any problems, there is no need to return our call.  We will assume that you have returned to your regular daily activities without incident.  If any biopsies were taken you will be contacted by phone or by letter within the next 1-3 weeks.  Please call us at [phone] if you have not heard about the biopsies in 3 weeks.    SIGNATURES/CONFIDENTIALITY: You and/or your care partner have signed paperwork which will be entered into your electronic medical record.  These signatures attest to the fact that that the information above on your After Visit Summary has been reviewed and is understood.  Full responsibility of the confidentiality of this discharge information lies with you and/or your care-partner.  Please review diverticulosis, high fiber diet, and hemorrhoid handouts provided. Next colonoscopy in 5 years.

## 2016-02-21 NOTE — Progress Notes (Signed)
To recovery, report to Myers, RN, VSS. 

## 2016-02-22 ENCOUNTER — Telehealth: Payer: Self-pay

## 2016-02-22 NOTE — Telephone Encounter (Signed)
  Follow up Call-  Call back number 02/21/2016  Post procedure Call Back phone  # [phone]  Permission to leave phone message Yes    Patient was called for follow up after his procedure on 02/21/2016. No answer at the number given for follow up phone call. A message was left on the answering machine.

## 2018-05-29 DIAGNOSIS — E785 Hyperlipidemia, unspecified: Secondary | ICD-10-CM | POA: Insufficient documentation

## 2018-06-03 ENCOUNTER — Other Ambulatory Visit: Payer: Self-pay | Admitting: Endocrinology

## 2018-06-03 ENCOUNTER — Ambulatory Visit
Admission: RE | Admit: 2018-06-03 | Discharge: 2018-06-03 | Disposition: A | Discharge status: Home / Self Care | Payer: Medicare Other | Source: Ambulatory Visit | Attending: Endocrinology | Admitting: Endocrinology

## 2018-06-03 DIAGNOSIS — K5730 Diverticulosis of large intestine without perforation or abscess without bleeding: Secondary | ICD-10-CM

## 2018-06-03 DIAGNOSIS — R1032 Left lower quadrant pain: Secondary | ICD-10-CM

## 2018-06-03 DIAGNOSIS — K429 Umbilical hernia without obstruction or gangrene: Secondary | ICD-10-CM

## 2018-06-03 DIAGNOSIS — K4020 Bilateral inguinal hernia, without obstruction or gangrene, not specified as recurrent: Secondary | ICD-10-CM

## 2018-06-03 HISTORY — DX: Bilateral inguinal hernia, without obstruction or gangrene, not specified as recurrent: K40.20

## 2018-06-03 HISTORY — DX: Umbilical hernia without obstruction or gangrene: K42.9

## 2018-12-04 ENCOUNTER — Ambulatory Visit: Payer: Medicare Other | Admitting: Physician Assistant

## 2018-12-04 ENCOUNTER — Encounter

## 2018-12-04 ENCOUNTER — Encounter: Payer: Self-pay | Admitting: Physician Assistant

## 2018-12-04 VITALS — BP 148/66 | HR 76 | Ht 69.0 in | Wt 256.4 lb

## 2018-12-04 DIAGNOSIS — R1011 Right upper quadrant pain: Secondary | ICD-10-CM

## 2018-12-04 DIAGNOSIS — R194 Change in bowel habit: Secondary | ICD-10-CM

## 2018-12-04 DIAGNOSIS — R110 Nausea: Secondary | ICD-10-CM

## 2018-12-04 DIAGNOSIS — R1012 Left upper quadrant pain: Secondary | ICD-10-CM

## 2018-12-04 DIAGNOSIS — K625 Hemorrhage of anus and rectum: Secondary | ICD-10-CM

## 2018-12-04 NOTE — Patient Instructions (Addendum)
Continue Omeprazole 40 mg, take 1 tablet every morning for 1 month.  You have been scheduled for a CT scan of the abdomen and pelvis at [facility].   You are scheduled on Monday 12-07-2018 at 7:00 am. You should arrive 15 minutes prior to your appointment time for registration. Please follow the written instructions below on the day of your exam:  WARNING: IF YOU ARE ALLERGIC TO IODINE/X-RAY DYE, PLEASE NOTIFY RADIOLOGY IMMEDIATELY AT [phone]! YOU WILL BE GIVEN A 13 HOUR PREMEDICATION PREP.  1) Do not eat  anything after 3:00 am (4 hours prior to your test) 2) You have been given 2 bottles of oral contrast to drink. The solution may taste better if refrigerated, but do NOT add ice or any other liquid to this solution. Shake well before drinking.    Drink 1 bottle of contrast @ 5:00 am (2 hours prior to your exam)  Drink 1 bottle of contrast @ 6:00 am (1 hour prior to your exam)  You may take any medications as prescribed with a small amount of water, if necessary. If you take any of the following medications: METFORMIN, GLUCOPHAGE, GLUCOVANCE, AVANDAMET, RIOMET, FORTAMET, [place] MET, JANUMET, GLUMETZA or METAGLIP, you MAY be asked to HOLD this medication 48 hours AFTER the exam.  The purpose of you drinking the oral contrast is to aid in the visualization of your intestinal tract. The contrast solution may cause some diarrhea. Depending on your individual set of symptoms, you may also receive an intravenous injection of x-ray contrast/dye. This test typically takes 30-45 minutes to complete.  Normal BMI (Body Mass Index- based on height and weight) is between 23 and 30. Your BMI today is Body mass index is 37.86 kg/m. Marland Kitchen Please consider follow up  regarding your BMI with your Primary Care Provider.    If you have any questions regarding your exam or if you need to reschedule, you may call the CT department at [phone] between the hours of 8:00 am and 5:00 pm,  Monday-Friday.  ________________________________________________________________________

## 2018-12-07 ENCOUNTER — Encounter: Payer: Self-pay | Admitting: Physician Assistant

## 2018-12-07 ENCOUNTER — Ambulatory Visit (HOSPITAL_COMMUNITY)
Admission: RE | Admit: 2018-12-07 | Discharge: 2018-12-07 | Disposition: A | Discharge status: Home / Self Care | Payer: Medicare Other | Source: Ambulatory Visit | Attending: Physician Assistant | Admitting: Physician Assistant

## 2018-12-07 ENCOUNTER — Encounter (HOSPITAL_COMMUNITY): Payer: Self-pay

## 2018-12-07 DIAGNOSIS — R110 Nausea: Secondary | ICD-10-CM | POA: Diagnosis present

## 2018-12-07 DIAGNOSIS — R1011 Right upper quadrant pain: Secondary | ICD-10-CM | POA: Diagnosis present

## 2018-12-07 DIAGNOSIS — K625 Hemorrhage of anus and rectum: Secondary | ICD-10-CM | POA: Insufficient documentation

## 2018-12-07 DIAGNOSIS — R1012 Left upper quadrant pain: Secondary | ICD-10-CM | POA: Insufficient documentation

## 2018-12-07 DIAGNOSIS — R194 Change in bowel habit: Secondary | ICD-10-CM | POA: Insufficient documentation

## 2018-12-07 DIAGNOSIS — K760 Fatty (change of) liver, not elsewhere classified: Secondary | ICD-10-CM

## 2018-12-07 HISTORY — DX: Fatty (change of) liver, not elsewhere classified: K76.0

## 2018-12-07 MED ORDER — IOHEXOL 300 MG/ML  SOLN
100.0000 mL | Freq: Once | INTRAMUSCULAR | Status: AC | PRN
  Administered 2018-12-07: 100 mL via INTRAVENOUS

## 2018-12-07 MED ORDER — SODIUM CHLORIDE (PF) 0.9 % IJ SOLN
INTRAMUSCULAR | Status: AC
  Filled 2018-12-07: qty 50

## 2018-12-07 NOTE — Progress Notes (Signed)
Subjective:    Patient ID: Joseph Mccall, male    DOB: 12-16-1951, 67 y.o.   MRN: [id number]  HPI Joseph Mccall is a 67 year old white male, known to Dr. Fuller Plan who is referred today by Dr. Baldwin Crown office after recent episode of nausea vomiting abdominal pain and diarrhea which was associated with hematochezia. Patient has history of morbid obesity, anxiety, sleep apnea, fatty liver, hypothyroidism and diverticulitis. He was last seen here in 2017 when he had colonoscopy for follow-up of adenomatous polyps.  He was noted to have multiple moderate sigmoid diverticulosis, and grade 1 internal hemorrhoids but no polyps.  He was indicated for 5-year interval follow-up.  He has not had prior EGD. Patient relates that he has had intermittent diarrhea over the past 6 months or so.  He may go for weeks with normal bowel movements and then have an episode of diarrhea can last for several days.  He says the stools will be loose to watery at that time and nonbloody.  He may have 2-3 bowel movements per day during these episodes. For acutely he had an episode this past weekend with acute epigastric pain which radiated into his mid abdomen, which was associated with nausea and one episode of emesis.  He had no associated fever or chills but says he felt bad all through the next day with ongoing epigastric pain she says was fairly intense at times.  This gradually resolved over the next couple of days and currently he is feeling better.  He is able to eat without any abdominal discomfort and the nausea has resolved.  He also had associated diarrhea with this episode and saw some red blood mixed in with his bowel movements. He was started on omeprazole by Dr. Baldwin Crown office, and labs were done on 11/30/2018.  Electrolytes were within normal limits, WBC of 4.6, hemoglobin 16.4, hematocrit of 51.2 platelets 123.  Hemosure done last month was negative. He had not been started on any new medications recently, no recent  antibiotics, no regular aspirin or NSAIDs.  Review of Systems Pertinent positive and negative review of systems were noted in the above HPI section.  All other review of systems was otherwise negative.  Outpatient Encounter Medications as of 12/04/2018  Medication Sig  . Ciclopirox (LOPROX) 1 % shampoo Apply topically at bedtime.  . clobetasol cream (TEMOVATE) 0.63 % Apply 1 application topically 2 (two) times daily. Reported on 02/21/2016  . diazepam (VALIUM) 5 MG tablet Take 5 mg by mouth every 6 (six) hours as needed for anxiety.  . ondansetron (ZOFRAN-ODT) 8 MG disintegrating tablet DISSOLVE 1 TABLET BY MOUTH ON THE TONGUE - CAN REPEAT IN 6 - 8 HOURS AS NEEDED NAUSEA  . pantoprazole (PROTONIX) 40 MG tablet Take 40 mg by mouth daily.  . Probiotic Product (RESTORA PO) Take 1 tablet by mouth daily.  . [DISCONTINUED] aspirin 81 MG tablet Take 81 mg by mouth daily.  . [DISCONTINUED] fenofibrate 160 MG tablet Take 160 mg by mouth daily.  . [DISCONTINUED] ketoconazole (NIZORAL) 200 MG tablet Take 200 mg by mouth 2 (two) times daily. As needed  . [DISCONTINUED] levothyroxine (SYNTHROID, LEVOTHROID) 112 MCG tablet Take 112 mcg by mouth daily before breakfast.  . [DISCONTINUED] omega-3 acid ethyl esters (LOVAZA) 1 G capsule Take by mouth 2 (two) times daily.  . [DISCONTINUED] omeprazole (PRILOSEC) 40 MG capsule Take 40 mg by mouth daily.   No facility-administered encounter medications on file as of 12/04/2018.    No Known Allergies Patient Active  Problem List   Diagnosis Date Noted  . Other mixed anxiety disorders 08/30/2015  . Insomnia with sleep apnea 08/30/2015  . Claustrophobia 08/30/2015  . OSA (obstructive sleep apnea) 08/30/2015  . Severe obesity (BMI >= 40) ([place]) 08/30/2015  . DIVERTICULITIS-COLON 08/29/2010  . FATTY LIVER DISEASE 08/29/2010  . HEMATOCHEZIA 08/29/2010  . DIARRHEA 08/29/2010   Social History   Socioeconomic History  . Marital status: Married    Spouse name: Not  on file  . Number of children: Not on file  . Years of education: Not on file  . Highest education level: Not on file  Occupational History  . Not on file  Social Needs  . Financial resource strain: Not on file  . Food insecurity:    Worry: Not on file    Inability: Not on file  . Transportation needs:    Medical: Not on file    Non-medical: Not on file  Tobacco Use  . Smoking status: Former Smoker    Last attempt to quit: 12/30/1988    Years since quitting: 29.9  . Smokeless tobacco: Never Used  Substance and Sexual Activity  . Alcohol use: Yes    Alcohol/week: 21.0 standard drinks    Types: 21 Standard drinks or equivalent per week    Comment: socially  . Drug use: No  . Sexual activity: Not on file  Lifestyle  . Physical activity:    Days per week: Not on file    Minutes per session: Not on file  . Stress: Not on file  Relationships  . Social connections:    Talks on phone: Not on file    Gets together: Not on file    Attends religious service: Not on file    Active member of club or organization: Not on file    Attends meetings of clubs or organizations: Not on file    Relationship status: Not on file  . Intimate partner violence:    Fear of current or ex partner: Not on file    Emotionally abused: Not on file    Physically abused: Not on file    Forced sexual activity: Not on file  Other Topics Concern  . Not on file  Social History Narrative   Denies caffeine consumption.    Joseph Mccall family history includes Colon cancer in his paternal aunt; Lung disease in his father; Stomach cancer in his maternal grandmother.      Objective:    Vitals:   12/04/18 1500  BP: (!) 148/66  Pulse: 76    Physical Exam well-developed obese white male in no acute distress, accompanied by his wife, both pleasant blood pressure 148/66 pulse 76, height 5 foot 9, weight 256, BMI 37.8.  HEENT ;nontraumatic normocephalic EOMI PERRLA sclera anicteric oral mucosa moist,  Cardiovascular r;egular rate and rhythm with S1-S2 no murmur rub or gallop, Pulmonary; clear bilaterally, Abdomen; obese, soft, basically nontender there is no palpable mass or hepatosplenomegaly, bowel sounds are present, Rectal ;exam not done today, Extremities; no clubbing cyanosis or edema skin warm and dry, Neuropsych ;alert and oriented, grossly nonfocal mood and affect appropriate       Assessment & Plan:   #34 67 year old white male with intermittent episodes of diarrhea over the past 6 months or so which may occur every couple of weeks with loose stools lasting for up to several days at a time.  He had not noticed any blood in his stool until this past weekend when he had an acute  episode that was also associated with epigastric abdominal pain nausea vomiting x1 and a bout of diarrhea with streaky hematochezia.  At this point his acute symptoms have resolved, as has the diarrhea and he is no longer seeing any blood streaking.  Etiology of symptoms is not clear.  He may have had an acute infectious gastroenteritis accounting for this most recent episode.  With sharp epigastric pain nausea and vomiting also consider gallbladder disease or pancreatic disease His more chronic intermittent symptoms a be secondary to IBS.  Meds are reviewed and no obvious offenders. Consider underlying mild colitis  #2 diverticulosis #3.  Previously documented internal hemorrhoids #4.  Prior history of adenomatous colon polyps-no polyps at most recent colonoscopy 2017 #5.  Morbid obesity #7.  Sleep apnea #8.  Fatty liver #9.  Anxiety   Plan; Patient had been started on Protonix 40 mg p.o. every morning and will continue for now x1 month. Will obtain CT scan of the abdomen and pelvis with contrast.  If that is unrevealing, and he has persistence of intermittent diarrhea and/or recurrence of acute episode will need to consider colonoscopy and EGD with Dr. Fuller Plan.  Amy Genia Harold PA-C 12/07/2018   Cc:  Reynold Bowen, MD

## 2018-12-08 ENCOUNTER — Telehealth: Payer: Self-pay | Admitting: Physician Assistant

## 2018-12-08 NOTE — Telephone Encounter (Signed)
-----   Message from Alfredia Ferguson, PA-C sent at 12/07/2018  1:20 PM EST ----- Please let pt know the CT scan looks good- gallbladder, pancreas both normal , no sign of colitis or thickening of small bowel or colon, no diverticulitis- he has small bilateral inguinal hernias which contain fat -no bowel... So no findings to explain his pain, or bleeding - see how he is feeling - we had discussed possible colon +/-  EGD

## 2018-12-08 NOTE — Telephone Encounter (Signed)
Patient requesting a callback when CT results from yesterday 12.9.19 are back and reviewed.

## 2018-12-08 NOTE — Telephone Encounter (Signed)
Discussed with the patient. He says the bleeding has decreased and he would rather discuss procedures with his GP. He has my name and will call back to let me know his decision.

## 2018-12-08 NOTE — Progress Notes (Signed)
Reviewed and agree with initial management plan.  Terrilee Dudzik T. Keiton Cosma, MD FACG 

## 2018-12-16 ENCOUNTER — Ambulatory Visit (AMBULATORY_SURGERY_CENTER): Payer: Self-pay | Admitting: *Deleted

## 2018-12-16 VITALS — Ht 69.0 in | Wt 258.0 lb

## 2018-12-16 DIAGNOSIS — R110 Nausea: Secondary | ICD-10-CM

## 2018-12-16 DIAGNOSIS — R1012 Left upper quadrant pain: Secondary | ICD-10-CM

## 2018-12-16 DIAGNOSIS — R194 Change in bowel habit: Secondary | ICD-10-CM

## 2018-12-16 MED ORDER — NA SULFATE-K SULFATE-MG SULF 17.5-3.13-1.6 GM/177ML PO SOLN: 1.0000 | Freq: Once | ORAL | 0 refills | Status: AC

## 2018-12-16 NOTE — Progress Notes (Signed)
No egg or soy allergy known to patient  No issues with past sedation with any surgeries  or procedures, no intubation problems  No diet pills per patient No home 02 use per patient  No blood thinners per patient  Pt denies issues with constipation  No A fib or A flutter  EMMI video sent to pt's e mail --  Wife in [place] with pt today

## 2018-12-21 ENCOUNTER — Encounter: Payer: Self-pay | Admitting: Gastroenterology

## 2019-01-04 ENCOUNTER — Ambulatory Visit (AMBULATORY_SURGERY_CENTER): Payer: Medicare Other | Admitting: Gastroenterology

## 2019-01-04 ENCOUNTER — Encounter: Payer: Self-pay | Admitting: Gastroenterology

## 2019-01-04 VITALS — BP 115/80 | HR 70 | Temp 97.8°F | Resp 14 | Ht 69.0 in | Wt 256.0 lb

## 2019-01-04 DIAGNOSIS — K2970 Gastritis, unspecified, without bleeding: Secondary | ICD-10-CM

## 2019-01-04 DIAGNOSIS — R1012 Left upper quadrant pain: Secondary | ICD-10-CM

## 2019-01-04 DIAGNOSIS — D124 Benign neoplasm of descending colon: Secondary | ICD-10-CM

## 2019-01-04 DIAGNOSIS — K5520 Angiodysplasia of colon without hemorrhage: Secondary | ICD-10-CM | POA: Diagnosis not present

## 2019-01-04 DIAGNOSIS — K921 Melena: Secondary | ICD-10-CM

## 2019-01-04 DIAGNOSIS — K5790 Diverticulosis of intestine, part unspecified, without perforation or abscess without bleeding: Secondary | ICD-10-CM

## 2019-01-04 DIAGNOSIS — K2951 Unspecified chronic gastritis with bleeding: Secondary | ICD-10-CM

## 2019-01-04 HISTORY — PX: UPPER GI ENDOSCOPY: SHX6162

## 2019-01-04 HISTORY — DX: Diverticulosis of intestine, part unspecified, without perforation or abscess without bleeding: K57.90

## 2019-01-04 HISTORY — PX: COLONOSCOPY W/ POLYPECTOMY: SHX1380

## 2019-01-04 MED ORDER — SODIUM CHLORIDE 0.9 % IV SOLN: 500.0000 mL | Freq: Once | INTRAVENOUS | Status: DC

## 2019-01-04 NOTE — Progress Notes (Signed)
PT taken to PACU. Monitors in place. VSS. Report given to RN. 

## 2019-01-04 NOTE — Op Note (Signed)
[facility] Patient Name: Joseph Mccall Procedure Date: 01/04/2019 2:28 PM MRN: [id number] Endoscopist: Ladene Artist , MD Age: 68 Referring MD:  Date of Birth: January 17, 1951 Gender: Male Account #: [id number] Procedure:                Colonoscopy Indications:              Hematochezia Medicines:                Monitored Anesthesia Care Procedure:                Pre-Anesthesia Assessment:                           - Prior to the procedure, a History and Physical                            was performed, and patient medications and                            allergies were reviewed. The patient's tolerance of                            previous anesthesia was also reviewed. The risks                            and benefits of the procedure and the sedation                            options and risks were discussed with the patient.                            All questions were answered, and informed consent                            was obtained. Prior Anticoagulants: The patient has                            taken no previous anticoagulant or antiplatelet                            agents. ASA Grade Assessment: II - A patient with                            mild systemic disease. After reviewing the risks                            and benefits, the patient was deemed in                            satisfactory condition to undergo the procedure.                           After obtaining informed consent, the colonoscope  was passed under direct vision. Throughout the                            procedure, the patient's blood pressure, pulse, and                            oxygen saturations were monitored continuously. The                            Colonoscope was introduced through the anus and                            advanced to the the cecum, identified by                            appendiceal orifice and ileocecal valve. The               ileocecal valve, appendiceal orifice, and rectum                            were photographed. The quality of the bowel                            preparation was excellent. The colonoscopy was                            performed without difficulty. The patient tolerated                            the procedure well. Scope In: 2:32:40 PM Scope Out: 2:47:55 PM Scope Withdrawal Time: 0 hours 13 minutes 23 seconds  Total Procedure Duration: 0 hours 15 minutes 15 seconds  Findings:                 The perianal and digital rectal examinations were                            normal.                           Two sessile polyps were found in the descending                            colon. The polyps were 7 to 8 mm in size. These                            polyps were removed with a cold snare. Resection                            and retrieval were complete.                           Two small localized angiodysplastic lesions without  bleeding were found in the cecum.                           Multiple medium-mouthed diverticula were found in                            the left colon. There was no evidence of                            diverticular bleeding.                           Internal hemorrhoids were found during                            retroflexion. The hemorrhoids were small and Grade                            I (internal hemorrhoids that do not prolapse).                           The exam was otherwise without abnormality on                            direct and retroflexion views. Complications:            No immediate complications. Estimated blood loss:                            None. Estimated Blood Loss:     Estimated blood loss: none. Impression:               - Two 7 to 8 mm polyps in the descending colon,                            removed with a cold snare. Resected and retrieved.                           - Two non-bleeding  colonic angiodysplastic lesions.                           - Mild diverticulosis in the left colon. There was                            no evidence of diverticular bleeding.                           - Internal hemorrhoids.                           - The examination was otherwise normal on direct                            and retroflexion views. Recommendation:           - Repeat colonoscopy in 5 years for surveillance  for personal history of adenomatous colon polyps.                           - Patient has a contact number available for                            emergencies. The signs and symptoms of potential                            delayed complications were discussed with the                            patient. Return to normal activities tomorrow.                            Written discharge instructions were provided to the                            patient.                           - High fiber diet.                           - Continue present medications.                           - Await pathology results. Ladene Artist, MD 01/04/2019 2:51:49 PM This report has been signed electronically.

## 2019-01-04 NOTE — Progress Notes (Signed)
Called to room to assist during endoscopic procedure.  Patient ID and intended procedure confirmed with present staff. Received instructions for my participation in the procedure from the performing physician.  

## 2019-01-04 NOTE — Patient Instructions (Addendum)
Information on high fiber diet, hemorrhoids, polyps, and diverticulosis given to you today.  Await pathology results. YOU HAD AN ENDOSCOPIC PROCEDURE TODAY AT [facility]:   Refer to the procedure report that was given to you for any specific questions about what was found during the examination.  If the procedure report does not answer your questions, please call your gastroenterologist to clarify.  If you requested that your care partner not be given the details of your procedure findings, then the procedure report has been included in a sealed envelope for you to review at your convenience later.  YOU SHOULD EXPECT: Some feelings of bloating in the abdomen. Passage of more gas than usual.  Walking can help get rid of the air that was put into your GI tract during the procedure and reduce the bloating. If you had a lower endoscopy (such as a colonoscopy or flexible sigmoidoscopy) you may notice spotting of blood in your stool or on the toilet paper. If you underwent a bowel prep for your procedure, you may not have a normal bowel movement for a few days.  Please Note:  You might notice some irritation and congestion in your nose or some drainage.  This is from the oxygen used during your procedure.  There is no need for concern and it should clear up in a day or so.  SYMPTOMS TO REPORT IMMEDIATELY:   Following lower endoscopy (colonoscopy or flexible sigmoidoscopy):  Excessive amounts of blood in the stool  Significant tenderness or worsening of abdominal pains  Swelling of the abdomen that is new, acute  Fever of 100F or higher   Following upper endoscopy (EGD)  Vomiting of blood or coffee ground material  New chest pain or pain under the shoulder blades  Painful or persistently difficult swallowing  New shortness of breath  Fever of 100F or higher  Black, tarry-looking stools  For urgent or emergent issues, a gastroenterologist can be reached at any hour by calling  [phone].   DIET:  We do recommend a small meal at first, but then you may proceed to your regular diet.  Drink plenty of fluids but you should avoid alcoholic beverages for 24 hours.  ACTIVITY:  You should plan to take it easy for the rest of today and you should NOT DRIVE or use heavy machinery until tomorrow (because of the sedation medicines used during the test).    FOLLOW UP: Our staff will call the number listed on your records the next business day following your procedure to check on you and address any questions or concerns that you may have regarding the information given to you following your procedure. If we do not reach you, we will leave a message.  However, if you are feeling well and you are not experiencing any problems, there is no need to return our call.  We will assume that you have returned to your regular daily activities without incident.  If any biopsies were taken you will be contacted by phone or by letter within the next 1-3 weeks.  Please call us at [phone] if you have not heard about the biopsies in 3 weeks.    SIGNATURES/CONFIDENTIALITY: You and/or your care partner have signed paperwork which will be entered into your electronic medical record.  These signatures attest to the fact that that the information above on your After Visit Summary has been reviewed and is understood.  Full responsibility of the confidentiality of this discharge information lies  with you and/or your care-partner.

## 2019-01-04 NOTE — Op Note (Signed)
[place] Patient Name: Joseph Mccall Procedure Date: 01/04/2019 2:28 PM MRN: [id number] Endoscopist: Ladene Artist , MD Age: 68 Referring MD:  Date of Birth: 05-02-1951 Gender: Male Account #: [id number] Procedure:                Upper GI endoscopy Indications:              Abdominal pain in the left upper quadrant Medicines:                Monitored Anesthesia Care Procedure:                Pre-Anesthesia Assessment:                           - Prior to the procedure, a History and Physical                            was performed, and patient medications and                            allergies were reviewed. The patient's tolerance of                            previous anesthesia was also reviewed. The risks                            and benefits of the procedure and the sedation                            options and risks were discussed with the patient.                            All questions were answered, and informed consent                            was obtained. Prior Anticoagulants: The patient has                            taken no previous anticoagulant or antiplatelet                            agents. ASA Grade Assessment: II - A patient with                            mild systemic disease. After reviewing the risks                            and benefits, the patient was deemed in                            satisfactory condition to undergo the procedure.                           After obtaining informed consent, the endoscope was  passed under direct vision. Throughout the                            procedure, the patient's blood pressure, pulse, and                            oxygen saturations were monitored continuously. The                            Endoscope was introduced through the mouth, and                            advanced to the second part of duodenum. The upper                            GI endoscopy was  accomplished without difficulty.                            The patient tolerated the procedure well. Scope In: Scope Out: Findings:                 The examined esophagus was normal.                           Patchy mildly erythematous mucosa without bleeding                            was found in the gastric body. Biopsies were taken                            with a cold forceps for histology.                           The exam of the stomach was otherwise normal.                           The duodenal bulb and second portion of the                            duodenum were normal. Complications:            No immediate complications. Estimated Blood Loss:     Estimated blood loss was minimal. Impression:               - Normal esophagus.                           - Erythematous mucosa in the gastric body. Biopsied.                           - Normal duodenal bulb and second portion of the                            duodenum. Recommendation:           - Patient has a contact number available for  emergencies. The signs and symptoms of potential                            delayed complications were discussed with the                            patient. Return to normal activities tomorrow.                            Written discharge instructions were provided to the                            patient.                           - Resume previous diet.                           - Continue present medications.                           - Await pathology results. Ladene Artist, MD 01/04/2019 3:01:38 PM This report has been signed electronically.

## 2019-01-05 ENCOUNTER — Telehealth: Payer: Self-pay

## 2019-01-05 NOTE — Telephone Encounter (Signed)
Left message on f/u call 

## 2019-01-05 NOTE — Telephone Encounter (Signed)
No answer, left message to call if having any issues or concerns, B.Marguita Venning RN 

## 2019-01-12 ENCOUNTER — Encounter: Payer: Self-pay | Admitting: Gastroenterology

## 2019-01-25 ENCOUNTER — Telehealth: Payer: Self-pay | Admitting: Gastroenterology

## 2019-01-25 MED ORDER — DICYCLOMINE HCL 10 MG PO CAPS: 10.0000 mg | ORAL_CAPSULE | Freq: Three times a day (TID) | ORAL | 2 refills | Status: DC | PRN

## 2019-01-25 NOTE — Telephone Encounter (Signed)
Patient notified rx sent He understands to call back if his symptoms have not improved after a few weeks.

## 2019-01-25 NOTE — Telephone Encounter (Signed)
Trial of dicyclomine 10 mg po tid prn, #60, 2 refills. Stay on pantoprazole. If not better then REV with me or APP.

## 2019-01-25 NOTE — Telephone Encounter (Signed)
Pt had EGD Jan 24, 2019 with Dr. Fuller Plan.  Pt reports he still has gastritis pain.

## 2019-01-25 NOTE — Telephone Encounter (Signed)
Patient reports that he is still experiencing LUQ pain mostly at night, despite Pantoprazole QD.  S/P EGD that showed gastritis.  He is asking for the next steps.

## 2019-03-26 ENCOUNTER — Telehealth: Payer: Self-pay | Admitting: Gastroenterology

## 2019-03-26 NOTE — Telephone Encounter (Signed)
Webex visit okay with Dr. Fuller Plan Left message for patient to call back

## 2019-03-26 NOTE — Telephone Encounter (Signed)
Pt called and wanted to make off visit with doctor did advised that we are offering webex visits.. pt stated that he is having flare up from his gastritis and that he is taking the medication that was prescribe to him however it has not been helping and he is needing advice.

## 2019-03-27 ENCOUNTER — Telehealth: Payer: Self-pay | Admitting: Gastroenterology

## 2019-03-27 NOTE — Telephone Encounter (Signed)
Patient called reporting 3 days of a "gastritis flare-up" not responding to dicyclomine QID and pantoprazole 40 mg daily. Same pain that he has described to Dr. Fuller Plan in the past. Symptoms worse at night. No alarm features identified.  Reviewed colonoscopy and EGD results as well as gastric biopsies from 01/04/19 showing gastritis with no H pylori.  Reviewed CT scan of abd/pelvis with contrast 12/07/18 showing sigmoid diverticulosis and fatty liver. No findings to explain his abdominal pain.   He called the office yesterday requesting assistance with these symptoms but was unable to connect with our schedulers. Requesting WebEx or phone call with Dr. Fuller Plan early this week.  I recommended that he continue dicyclomine and pantoprazole 40 mg QAM and add famotidine 20 mg BID. He was instructed to obtain the famotidine OTC.  He was instructed to call the office on Monday to make an appointment.  May want to consider TCA or buspirone for functional dyspepsia.

## 2019-03-29 ENCOUNTER — Telehealth: Payer: Self-pay | Admitting: Gastroenterology

## 2019-03-29 ENCOUNTER — Other Ambulatory Visit: Payer: Self-pay

## 2019-03-29 ENCOUNTER — Ambulatory Visit (INDEPENDENT_AMBULATORY_CARE_PROVIDER_SITE_OTHER): Payer: Medicare Other | Admitting: Gastroenterology

## 2019-03-29 ENCOUNTER — Encounter: Payer: Self-pay | Admitting: Gastroenterology

## 2019-03-29 DIAGNOSIS — R634 Abnormal weight loss: Secondary | ICD-10-CM | POA: Diagnosis not present

## 2019-03-29 DIAGNOSIS — R1013 Epigastric pain: Secondary | ICD-10-CM | POA: Diagnosis not present

## 2019-03-29 MED ORDER — TRAMADOL HCL 50 MG PO TABS: 50.0000 mg | ORAL_TABLET | Freq: Three times a day (TID) | ORAL | 0 refills | Status: DC | PRN

## 2019-03-29 NOTE — Telephone Encounter (Signed)
He has a Telehealth appt today with me.

## 2019-03-29 NOTE — Telephone Encounter (Signed)
Tramodol 50 mg po tid prn abdominal pain, #20. No refills.  This is for short term use only.

## 2019-03-29 NOTE — Patient Instructions (Signed)
[facility] will contact patient to schedule his RUQ abdominal ultrasound urgently.

## 2019-03-29 NOTE — Telephone Encounter (Signed)
Patient notified

## 2019-03-29 NOTE — Telephone Encounter (Signed)
Pt called in wanting to know if the doctor will prescribe him some pain medicine for the pains that he is having. He stated he forgot to ask on the webex visit.

## 2019-03-29 NOTE — Telephone Encounter (Signed)
Pt had virtual visit with Dr. Fuller Plan today, pt wants to know if Dr. Fuller Plan can prescribe something for his pain, he states that pain intensifies at night so would like to have something sent to his pharmacy today if possible. He uses [facility] at [facility].

## 2019-03-29 NOTE — Telephone Encounter (Signed)
Patient requesting pain meds.  Forgot to discuss with you on Webex

## 2019-03-29 NOTE — Telephone Encounter (Signed)
I already asked Barbera Setters to send in a limited supply of Tramadol.

## 2019-03-29 NOTE — Progress Notes (Addendum)
History of Present Illness: This is a 68 year old male complaining of episodic epigastric pain associated with a decreased appetite during symptoms and weight loss.  His symptoms have been active since December in an unpredictable pattern.  He had a recent flare beginning Thursday.  Pain has been intermittent since Thursday currently during today's visit he is symptom-free.  He states he had a steak prior to the onset of his symptoms on Thursday.  He states he weighed himself and notes about a 23 pound weight loss since December. In December during pain he had nausea but no vomiting. No nausea recently. He spoke with Dr. Tarri Glenn who was on-call over the weekend who advised him to add famotidine 20 mg twice daily to pantoprazole 40 mg daily. Dicyclomine has not been effective for symptoms. He states that he has adjusted his diet over the past few months. Results of recent abdominal/pelvic CT scan, colonoscopy and EGD reviewed. Denies constipation, diarrhea, change in stool caliber, melena, hematochezia,  vomiting, dysphagia, reflux symptoms, chest pain.   No Known Allergies Outpatient Medications Prior to Visit  Medication Sig Dispense Refill  . Ciclopirox (LOPROX) 1 % shampoo Apply topically at bedtime.    . clobetasol cream (TEMOVATE) 0.25 % Apply 1 application topically 2 (two) times daily. Reported on 02/21/2016    . diazepam (VALIUM) 5 MG tablet Take 5 mg by mouth every 6 (six) hours as needed for anxiety.    . dicyclomine (BENTYL) 10 MG capsule Take 1 capsule (10 mg total) by mouth 3 (three) times daily as needed for spasms. 60 capsule 2  . ondansetron (ZOFRAN-ODT) 8 MG disintegrating tablet DISSOLVE 1 TABLET BY MOUTH ON THE TONGUE - CAN REPEAT IN 6 - 8 HOURS AS NEEDED NAUSEA  0  . pantoprazole (PROTONIX) 40 MG tablet Take 40 mg by mouth daily.    . Probiotic Product (RESTORA PO) Take 1 tablet by mouth daily.     No facility-administered medications prior to visit.    Past Medical History:   Diagnosis Date  . Abnormal transaminases   . Anxiety and depression   . Arthralgia   . Arthritis    knees  . Cataract    removed both eyes  . Chest pain   . Glucose intolerance (impaired glucose tolerance)   . History of colonic polyps   . Hyperlipidemia   . Hypothyroidism   . Insomnia   . Knee pain, bilateral   . Malaise and fatigue   . Obesity   . Sleep apnea    no cpap-last sleep test di dnot indicate sleep apnea  . Tinnitus   . Urinary hesitancy    Past Surgical History:  Procedure Laterality Date  . COLON BIOPSY    . colon polyp removal    . COLONOSCOPY    . POLYPECTOMY     Social History   Socioeconomic History  . Marital status: Married    Spouse name: Not on file  . Number of children: Not on file  . Years of education: Not on file  . Highest education level: Not on file  Occupational History  . Not on file  Social Needs  . Financial resource strain: Not on file  . Food insecurity:    Worry: Not on file    Inability: Not on file  . Transportation needs:    Medical: Not on file    Non-medical: Not on file  Tobacco Use  . Smoking status: Former Smoker    Last attempt to  quit: 12/30/1988    Years since quitting: 30.2  . Smokeless tobacco: Never Used  Substance and Sexual Activity  . Alcohol use: Yes    Alcohol/week: 21.0 standard drinks    Types: 21 Standard drinks or equivalent per week    Comment: socially  . Drug use: No  . Sexual activity: Not on file  Lifestyle  . Physical activity:    Days per week: Not on file    Minutes per session: Not on file  . Stress: Not on file  Relationships  . Social connections:    Talks on phone: Not on file    Gets together: Not on file    Attends religious service: Not on file    Active member of club or organization: Not on file    Attends meetings of clubs or organizations: Not on file    Relationship status: Not on file  Other Topics Concern  . Not on file  Social History Narrative   Denies caffeine  consumption.   Family History  Problem Relation Age of Onset  . Lung disease Father        pulmonary fibrosis  . Colon cancer Paternal Aunt   . Stomach cancer Maternal Grandmother   . Colon polyps Neg Hx   . Esophageal cancer Neg Hx   . Rectal cancer Neg Hx      Physical Exam: Telehealth - not done   Assessment and Recommendations:  1.  Recurrent, episodic epigastric pain, anorexia associated with pain, weight loss. R/O chronic cholecystitis, cholelithiasis.  No biliary abnormalities noted on CT scan in December.  Schedule RUQ ultrasound and if negative CCK HIDA.  Fat modified diet.  Avoid foods that trigger symptoms.  Continue current medications.    This service was provided via telehealth  The patient was located at home, in a private room.  The provider was located in my office, alone.  The patient consented to this telehealth visit and is aware of possible charges for this visit.  The other person participating in this telehealth service was Marlon Pel, [place] and their role was contacting the patient's after the call to schedule ultrasound, prepare AVS.  Time spent on visit/call: 22 minutes Time spent during visit, reviewing records, coordinating care: 33 minutes

## 2019-03-29 NOTE — Telephone Encounter (Signed)
Please advise Dr Stark  

## 2019-03-31 ENCOUNTER — Ambulatory Visit
Admission: RE | Admit: 2019-03-31 | Discharge: 2019-03-31 | Disposition: A | Discharge status: Home / Self Care | Payer: Medicare Other | Source: Ambulatory Visit | Attending: Gastroenterology | Admitting: Gastroenterology

## 2019-03-31 ENCOUNTER — Telehealth: Payer: Self-pay | Admitting: Gastroenterology

## 2019-03-31 ENCOUNTER — Other Ambulatory Visit: Payer: Self-pay

## 2019-03-31 DIAGNOSIS — R1013 Epigastric pain: Secondary | ICD-10-CM

## 2019-03-31 DIAGNOSIS — R634 Abnormal weight loss: Secondary | ICD-10-CM

## 2019-04-01 ENCOUNTER — Other Ambulatory Visit: Payer: Self-pay

## 2019-04-01 DIAGNOSIS — R1010 Upper abdominal pain, unspecified: Secondary | ICD-10-CM

## 2019-04-01 NOTE — Telephone Encounter (Signed)
I have contacted the patient. See the imaging results for details.

## 2019-04-08 ENCOUNTER — Other Ambulatory Visit: Payer: Self-pay

## 2019-04-08 ENCOUNTER — Ambulatory Visit (HOSPITAL_COMMUNITY)
Admission: RE | Admit: 2019-04-08 | Discharge: 2019-04-08 | Disposition: A | Discharge status: Home / Self Care | Payer: Medicare Other | Source: Ambulatory Visit | Attending: Gastroenterology | Admitting: Gastroenterology

## 2019-04-08 DIAGNOSIS — R1010 Upper abdominal pain, unspecified: Secondary | ICD-10-CM | POA: Insufficient documentation

## 2019-04-08 MED ORDER — TECHNETIUM TC 99M MEBROFENIN IV KIT
5.0000 | PACK | Freq: Once | INTRAVENOUS | Status: AC | PRN
  Administered 2019-04-08: 5 via INTRAVENOUS

## 2019-04-19 ENCOUNTER — Other Ambulatory Visit: Payer: Self-pay | Admitting: Surgery

## 2019-05-04 ENCOUNTER — Encounter (HOSPITAL_COMMUNITY): Payer: Self-pay

## 2019-05-04 NOTE — Patient Instructions (Addendum)
Your procedure is scheduled on: Thursday, May 06, 2019   Surgery Time: 8:45AM-9:45AM   Report to [facility]  Entrance    Report to admitting at 6:45 AM   Call this number if you have problems the morning of surgery [phone]   Do not eat food or drink liquids :After Midnight.   Brush your teeth the morning of surgery.   Do NOT smoke after Midnight   Take these medicines the morning of surgery with A SIP OF WATER: Pantoprazole, Valium if needed                               You may not have any metal on your body including jewelry, and body piercings             Do not wear lotions, powders, perfumes/cologne, or deodorant                           Men may shave face and neck.   Do not bring valuables to the hospital. Van.   Contacts, dentures or bridgework may not be worn into surgery.    Patients discharged the day of surgery will not be allowed to drive home.   Special Instructions: Bring a copy of your healthcare power of attorney and living will documents         the day of surgery if you haven't scanned them in before.              Please read over the following fact sheets you were given:  [facility] - Preparing for Surgery Before surgery, you can play an important role.  Because skin is not sterile, your skin needs to be as free of germs as possible.  You can reduce the number of germs on your skin by washing with CHG (chlorahexidine gluconate) soap before surgery.  CHG is an antiseptic cleaner which kills germs and bonds with the skin to continue killing germs even after washing. Please DO NOT use if you have an allergy to CHG or antibacterial soaps.  If your skin becomes reddened/irritated stop using the CHG and inform your nurse when you arrive at Short Stay. Do not shave (including legs and underarms) for at least 48 hours prior to the first CHG shower.  You may shave your  face/neck.  Please follow these instructions carefully:  1.  Shower with CHG Soap the night before surgery and the  morning of surgery.  2.  If you choose to wash your hair, wash your hair first as usual with your normal  shampoo.  3.  After you shampoo, rinse your hair and body thoroughly to remove the shampoo.                             4.  Use CHG as you would any other liquid soap.  You can apply chg directly to the skin and wash.  Gently with a scrungie or clean washcloth.  5.  Apply the CHG Soap to your body ONLY FROM THE NECK DOWN.   Do   not use on face/ open  Wound or open sores. Avoid contact with eyes, ears mouth and   genitals (private parts).                       Wash face,  Genitals (private parts) with your normal soap.             6.  Wash thoroughly, paying special attention to the area where your    surgery  will be performed.  7.  Thoroughly rinse your body with warm water from the neck down.  8.  DO NOT shower/wash with your normal soap after using and rinsing off the CHG Soap.                9.  Pat yourself dry with a clean towel.            10.  Wear clean pajamas.            11.  Place clean sheets on your bed the night of your first shower and do not  sleep with pets. Day of Surgery : Do not apply any lotions/deodorants the morning of surgery.  Please wear clean clothes to the hospital/surgery center.  FAILURE TO FOLLOW THESE INSTRUCTIONS MAY RESULT IN THE CANCELLATION OF YOUR SURGERY  PATIENT SIGNATURE_________________________________  NURSE SIGNATURE__________________________________  ________________________________________________________________________

## 2019-05-05 ENCOUNTER — Encounter (HOSPITAL_COMMUNITY): Payer: Self-pay

## 2019-05-05 ENCOUNTER — Other Ambulatory Visit: Payer: Self-pay

## 2019-05-05 ENCOUNTER — Encounter (HOSPITAL_COMMUNITY)
Admission: RE | Admit: 2019-05-05 | Discharge: 2019-05-05 | Disposition: A | Discharge status: Home / Self Care | Payer: Medicare Other | Source: Ambulatory Visit | Attending: Surgery | Admitting: Surgery

## 2019-05-05 DIAGNOSIS — K4020 Bilateral inguinal hernia, without obstruction or gangrene, not specified as recurrent: Secondary | ICD-10-CM | POA: Diagnosis not present

## 2019-05-05 DIAGNOSIS — Z01812 Encounter for preprocedural laboratory examination: Secondary | ICD-10-CM | POA: Insufficient documentation

## 2019-05-05 DIAGNOSIS — K828 Other specified diseases of gallbladder: Secondary | ICD-10-CM | POA: Diagnosis not present

## 2019-05-05 DIAGNOSIS — K811 Chronic cholecystitis: Secondary | ICD-10-CM | POA: Diagnosis present

## 2019-05-05 DIAGNOSIS — K429 Umbilical hernia without obstruction or gangrene: Secondary | ICD-10-CM | POA: Diagnosis not present

## 2019-05-05 DIAGNOSIS — Z87891 Personal history of nicotine dependence: Secondary | ICD-10-CM | POA: Diagnosis not present

## 2019-05-05 HISTORY — DX: Fatty (change of) liver, not elsewhere classified: K76.0

## 2019-05-05 HISTORY — DX: Cardiac arrhythmia, unspecified: I49.9

## 2019-05-05 HISTORY — DX: Other ill-defined heart diseases: I51.89

## 2019-05-05 HISTORY — DX: Diverticulosis of intestine, part unspecified, without perforation or abscess without bleeding: K57.90

## 2019-05-05 HISTORY — DX: Cardiomegaly: I51.7

## 2019-05-05 HISTORY — DX: Bilateral inguinal hernia, without obstruction or gangrene, not specified as recurrent: K40.20

## 2019-05-05 HISTORY — DX: Gastro-esophageal reflux disease without esophagitis: K21.9

## 2019-05-05 HISTORY — DX: Umbilical hernia without obstruction or gangrene: K42.9

## 2019-05-05 LAB — CBC
HCT: 47.1 % (ref 39.0–52.0)
Hemoglobin: 16.1 g/dL (ref 13.0–17.0)
MCH: 33.1 pg (ref 26.0–34.0)
MCHC: 34.2 g/dL (ref 30.0–36.0)
MCV: 96.9 fL (ref 80.0–100.0)
Platelets: 124 10*3/uL — ABNORMAL LOW (ref 150–400)
RBC: 4.86 MIL/uL (ref 4.22–5.81)
RDW: 13.1 % (ref 11.5–15.5)
WBC: 2.8 10*3/uL — ABNORMAL LOW (ref 4.0–10.5)
nRBC: 0 % (ref 0.0–0.2)

## 2019-05-05 NOTE — H&P (Signed)
Joseph Mccall Documented: 04/19/2019 3:07 PM Location: [facility] Patient #: [id number] DOB: 19-Jul-1951 Married / Language: Joseph Mccall / Race: White Male   History of Present Illness Joseph Mccall A. Ninfa Linden MD; 04/19/2019 3:29 PM) The patient is a 68 year old male who presents with abdominal pain. This patient is referred by Dr. Lucio Edward for abdominal pain and biliary dyskinesia. For several months, the patient having epigastric abdominal pain with a mild amount of bloating and some nausea. It will occasionally occur after fatty meals. The pain is moderate in intensity. He described as both sharp and burning. He refers occasionally to the right upper quadrant. He has had an extensive workup including CAT scan, endoscopy, ultrasound, and HIDA scan. These have all been normal except for a gallbladder ejection fraction of 22%. There is no evidence of gallstones. He has had some diarrhea. He is otherwise without complaints.   Past Surgical History Emeline Gins, [place]; 04/19/2019 3:07 PM) Cataract Surgery  Bilateral. Colon Polyp Removal - Colonoscopy   Allergies Emeline Gins, CMA; 04/19/2019 3:08 PM) No Known Drug Allergies [04/19/2019]: Allergies Reconciled   Medication History Emeline Gins, CMA; 04/19/2019 3:09 PM) Pantoprazole Sodium (40MG  Tablet DR, Oral) Active. diazePAM (5MG  Tablet, Oral) Active. Medications Reconciled  Social History Emeline Gins, [place]; 04/19/2019 3:07 PM) Alcohol use  Moderate alcohol use. No caffeine use  Tobacco use  Former smoker.  Family History Emeline Gins, [place]; 04/19/2019 3:07 PM) Arthritis  Mother. Heart disease in male family member before age 65  Hypertension  Mother. Respiratory Condition  Father.  Other Problems Emeline Gins, [place]; 04/19/2019 3:07 PM) Arthritis  Diverticulosis  Sleep Apnea     Review of Systems Emeline Gins CMA; 04/19/2019 3:07 PM) General Present- Appetite Loss. Not  Present- Chills, Fatigue, Fever, Night Sweats, Weight Gain and Weight Loss. HEENT Present- Hearing Loss. Not Present- Earache, Hoarseness, Nose Bleed, Oral Ulcers, Ringing in the Ears, Seasonal Allergies, Sinus Pain, Sore Throat, Visual Disturbances, Wears glasses/contact lenses and Yellow Eyes. Gastrointestinal Present- Abdominal Pain and Bloating. Not Present- Bloody Stool, Change in Bowel Habits, Chronic diarrhea, Constipation, Difficulty Swallowing, Excessive gas, Gets full quickly at meals, Hemorrhoids, Indigestion, Nausea, Rectal Pain and Vomiting. Male Genitourinary Not Present- Blood in Urine, Change in Urinary Stream, Frequency, Impotence, Nocturia, Painful Urination, Urgency and Urine Leakage. Endocrine Not Present- Cold Intolerance, Excessive Hunger, Hair Changes, Heat Intolerance, Hot flashes and New Diabetes.  Vitals Emeline Gins CMA; 04/19/2019 3:08 PM) 04/19/2019 3:08 PM Weight: 256.4 lb Height: 69in Body Surface Area: 2.3 m Body Mass Index: 37.86 kg/m  Temp.: 97.17F  Pulse: 88 (Regular)  BP: 132/82 (Sitting, Left Arm, Standard)       Physical Exam (Avaree Gilberti A. Ninfa Linden MD; 04/19/2019 3:29 PM) General Mental Status-Alert. General Appearance-Consistent with stated age. Hydration-Well hydrated. Voice-Normal.  Head and Neck Head-normocephalic, atraumatic with no lesions or palpable masses.  Eye Eyeball - Bilateral-Extraocular movements intact. Sclera/Conjunctiva - Bilateral-No scleral icterus.  Chest and Lung Exam Chest and lung exam reveals -quiet, even and easy respiratory effort with no use of accessory muscles and on auscultation, normal breath sounds, no adventitious sounds and normal vocal resonance. Inspection Chest Wall - Normal. Back - normal.  Cardiovascular Cardiovascular examination reveals -on palpation PMI is normal in location and amplitude, no palpable S3 or S4. Normal cardiac borders., normal heart sounds, regular rate  and rhythm with no murmurs, carotid auscultation reveals no bruits and normal pedal pulses bilaterally.  Abdomen Inspection Inspection of the abdomen reveals - No Hernias. Skin - Scar -  no surgical scars. Palpation/Percussion Palpation and Percussion of the abdomen reveal - Soft, Non Tender, No Rebound tenderness, No Rigidity (guarding) and No hepatosplenomegaly. Auscultation Auscultation of the abdomen reveals - Bowel sounds normal. Note: There is some mild tenderness in right upper quadrant. He has an easily reducible umbilical hernia. There are also small bilateral inguinal hernias   Neurologic - Did not examine.  Musculoskeletal - Did not examine.    Assessment & Plan (Jantzen Pilger A. Ninfa Linden MD; 04/19/2019 3:30 PM) BILIARY DYSKINESIA (K82.8) Impression: I have reviewed his notes, x-ray reports, endoscopy, and HIDA scan. I believe he has biliary dyskinesia and probably some mild chronic cholecystitis. I discussed the diagnosis with him in detail. We discussed continued expectant management versus proceeding with a laparoscopic cholecystectomy. I discussed the surgical procedure in detail and gave him literature regarding surgery. We discussed the risks which includes but is not limited to bleeding, infection, injury to surrounding structures, the need to convert to an open procedure, bile duct injury, bile leak, the chance this may not resolve his symptoms, cardiopulmonary issues, DVT, postoperative recovery, etc. At this point, he wishes to proceed with surgery which will be scheduled

## 2019-05-05 NOTE — Pre-Procedure Instructions (Signed)
CBC results sent to Dr. Ninfa Linden via epic. Lenise Arena. P.A. also aware of CBC results. No new orders received at this time.

## 2019-05-05 NOTE — Anesthesia Preprocedure Evaluation (Addendum)
Anesthesia Evaluation  Patient identified by MRN, date of birth, ID band Patient awake    Reviewed: Allergy & Precautions, NPO status , Patient's Chart, lab work & pertinent test results  Airway Mallampati: II  TM Distance: >3 FB Neck ROM: Full    Dental no notable dental hx.    Pulmonary neg sleep apnea, former smoker,    Pulmonary exam normal breath sounds clear to auscultation       Cardiovascular negative cardio ROS Normal cardiovascular exam Rhythm:Regular Rate:Normal     Neuro/Psych negative neurological ROS  negative psych ROS   GI/Hepatic negative GI ROS, Neg liver ROS,   Endo/Other  negative endocrine ROS  Renal/GU negative Renal ROS  negative genitourinary   Musculoskeletal negative musculoskeletal ROS (+)   Abdominal   Peds negative pediatric ROS (+)  Hematology negative hematology ROS (+)   Anesthesia Other Findings   Reproductive/Obstetrics negative OB ROS                            Anesthesia Physical Anesthesia Plan  ASA: II  Anesthesia Plan: General   Post-op Pain Management:    Induction: Intravenous  PONV Risk Score and Plan: 2 and Ondansetron and Treatment may vary due to age or medical condition  Airway Management Planned: Oral ETT  Additional Equipment:   Intra-op Plan:   Post-operative Plan: Extubation in OR  Informed Consent: I have reviewed the patients History and Physical, chart, labs and discussed the procedure including the risks, benefits and alternatives for the proposed anesthesia with the patient or authorized representative who has indicated his/her understanding and acceptance.     Dental advisory given  Plan Discussed with: CRNA  Anesthesia Plan Comments: (See PAT note 05/05/2019, Konrad Felix, PA-C)       Anesthesia Quick Evaluation

## 2019-05-05 NOTE — Progress Notes (Signed)
Anesthesia Chart Review   Case:  [id number] Date/Time:  05/06/19 0830   Procedure:  LAPAROSCOPIC CHOLECYSTECTOMY (N/A )   Anesthesia type:  General   Pre-op diagnosis:  biliary dyskinesia   Location:  [facility] ROOM 02 / [facility]   Surgeon:  Coralie Keens, MD      DISCUSSION:68 yo former smoker (10 pack years, quit 12/30/88) with h/o HLD, hypothyroidism, anxiety, depression, GERD, biliary dyskinesia scheduled for above procedure 05/06/19 with Dr. Coralie Keens.   Pt can proceed with planned procedure barring acute status change.  VS: BP (!) 141/62 (BP Location: Right Arm)   Pulse 65   Temp 36.6 C (Oral)   Resp 18   Ht 5\' 9"  (1.753 m)   Wt 113.5 kg   SpO2 95%   BMI 36.95 kg/m   PROVIDERS: Reynold Bowen, MD is PCP    LABS: Labs reviewed: Acceptable for surgery. (all labs ordered are listed, but only abnormal results are displayed)  Labs Reviewed  CBC - Abnormal; Notable for the following components:      Result Value   WBC 2.8 (*)    Platelets 124 (*)    All other components within normal limits     IMAGES:  EKG:   CV: Echo 12/12/15 Study Conclusions  - Left ventricle: The cavity size was normal. Wall thickness was   increased in a pattern of mild LVH. Systolic function was normal.   The estimated ejection fraction was in the range of 60% to 65%.   Wall motion was normal; there were no regional wall motion   abnormalities. Features are consistent with a pseudonormal left   ventricular filling pattern, with concomitant abnormal relaxation   and increased filling pressure (grade 2 diastolic dysfunction). - Aortic valve: There was no stenosis. - Aorta: Borderline dilated aortic root. Aortic root dimension: 37   mm (ED). - Mitral valve: Mildly calcified annulus. There was trivial   regurgitation. - Left atrium: The atrium was mildly dilated. - Right ventricle: The cavity size was normal. Systolic function   was normal. - Pulmonary arteries: No complete TR doppler  jet so unable to   estimate PA systolic pressure. - Inferior vena cava: The vessel was normal in size. The   respirophasic diameter changes were in the normal range (>= 50%),   consistent with normal central venous pressure.  Impressions:  - Normal LV size and systolic function, EF 04-54%. Mild LV   hypertrophy. Moderate diastolic dysfunction. Normal RV size and   systolic function. Past Medical History:  Diagnosis Date  . Abnormal transaminases   . Anxiety and depression   . Arthralgia   . Arthritis    knees  . Bilateral inguinal hernia 06/03/2018  . Cataract    removed both eyes  . Chest pain   . Diastolic dysfunction 09/81/1914   Moderate, Noted on ECHO  . Diverticulosis 01/04/2019   Mild, left colon  . Fatty liver 12/07/2018   Mild, Noted on CT Abd  . GERD (gastroesophageal reflux disease)   . Glucose intolerance (impaired glucose tolerance)   . History of colonic polyps   . Hyperlipidemia   . Hypothyroidism    No medication  . Insomnia   . Irregular heart beat    reduced caffefine intake has helped  . Knee pain, bilateral   . LVH (left ventricular hypertrophy) 12/12/2015   Mild, noted ECHO   . Malaise and fatigue   . Obesity   . Sleep apnea    no cpap-last  sleep test di dnot indicate sleep apnea  . Tinnitus   . Umbilical hernia 05/09/210  . Urinary hesitancy     Past Surgical History:  Procedure Laterality Date  . CATARACT EXTRACTION, BILATERAL    . COLON BIOPSY    . colon polyp removal    . COLONOSCOPY W/ POLYPECTOMY  01/04/2019  . POLYPECTOMY    . UPPER GI ENDOSCOPY  01/04/2019    MEDICATIONS: . Ciclopirox (LOPROX) 1 % shampoo  . diazepam (VALIUM) 5 MG tablet  . dicyclomine (BENTYL) 10 MG capsule  . famotidine-calcium carbonate-magnesium hydroxide (PEPCID COMPLETE) 10-800-165 MG chewable tablet  . pantoprazole (PROTONIX) 40 MG tablet  . Probiotic Product (RESTORA PO)  . Pseudoeph-Doxylamine-DM-APAP (NYQUIL PO)  . traMADol (ULTRAM) 50 MG  tablet   No current facility-administered medications for this encounter.      Maia Plan [facility] Pre-Surgical Testing [phone] 05/05/19 2:11 PM

## 2019-05-06 ENCOUNTER — Encounter (HOSPITAL_COMMUNITY): Payer: Self-pay

## 2019-05-06 ENCOUNTER — Ambulatory Visit (HOSPITAL_COMMUNITY)
Admission: RE | Admit: 2019-05-06 | Discharge: 2019-05-06 | Disposition: A | Discharge status: Home / Self Care | Payer: Medicare Other | Attending: Surgery | Admitting: Surgery

## 2019-05-06 ENCOUNTER — Ambulatory Visit (HOSPITAL_COMMUNITY): Payer: Medicare Other | Admitting: Certified Registered"

## 2019-05-06 ENCOUNTER — Encounter (HOSPITAL_COMMUNITY)
Admission: RE | Disposition: A | Discharge status: Home / Self Care | Payer: Self-pay | Source: Home / Self Care | Attending: Surgery

## 2019-05-06 ENCOUNTER — Ambulatory Visit (HOSPITAL_COMMUNITY): Payer: Medicare Other | Admitting: Physician Assistant

## 2019-05-06 ENCOUNTER — Other Ambulatory Visit: Payer: Self-pay

## 2019-05-06 DIAGNOSIS — K429 Umbilical hernia without obstruction or gangrene: Secondary | ICD-10-CM | POA: Insufficient documentation

## 2019-05-06 DIAGNOSIS — K828 Other specified diseases of gallbladder: Secondary | ICD-10-CM | POA: Insufficient documentation

## 2019-05-06 DIAGNOSIS — K811 Chronic cholecystitis: Secondary | ICD-10-CM | POA: Diagnosis not present

## 2019-05-06 DIAGNOSIS — Z87891 Personal history of nicotine dependence: Secondary | ICD-10-CM | POA: Diagnosis not present

## 2019-05-06 DIAGNOSIS — K4020 Bilateral inguinal hernia, without obstruction or gangrene, not specified as recurrent: Secondary | ICD-10-CM | POA: Insufficient documentation

## 2019-05-06 HISTORY — PX: CHOLECYSTECTOMY: SHX55

## 2019-05-06 SURGERY — LAPAROSCOPIC CHOLECYSTECTOMY
Anesthesia: General | Site: Abdomen

## 2019-05-06 MED ORDER — MIDAZOLAM HCL 2 MG/2ML IJ SOLN
INTRAMUSCULAR | Status: DC | PRN
  Administered 2019-05-06: 2 mg via INTRAVENOUS

## 2019-05-06 MED ORDER — ROCURONIUM BROMIDE 10 MG/ML (PF) SYRINGE
PREFILLED_SYRINGE | INTRAVENOUS | Status: AC
  Filled 2019-05-06: qty 10

## 2019-05-06 MED ORDER — BUPIVACAINE HCL (PF) 0.5 % IJ SOLN
INTRAMUSCULAR | Status: DC | PRN
  Administered 2019-05-06: 20 mL

## 2019-05-06 MED ORDER — LIDOCAINE 2% (20 MG/ML) 5 ML SYRINGE
INTRAMUSCULAR | Status: AC
  Filled 2019-05-06: qty 5

## 2019-05-06 MED ORDER — CHLORHEXIDINE GLUCONATE CLOTH 2 % EX PADS: 6.0000 | MEDICATED_PAD | Freq: Once | CUTANEOUS | Status: DC

## 2019-05-06 MED ORDER — FENTANYL CITRATE (PF) 250 MCG/5ML IJ SOLN
INTRAMUSCULAR | Status: DC | PRN
  Administered 2019-05-06 (×2): 50 ug via INTRAVENOUS
  Administered 2019-05-06: 100 ug via INTRAVENOUS

## 2019-05-06 MED ORDER — CEFAZOLIN SODIUM-DEXTROSE 2-4 GM/100ML-% IV SOLN
2.0000 g | INTRAVENOUS | Status: AC
  Administered 2019-05-06: 09:00:00 2 g via INTRAVENOUS
  Filled 2019-05-06: qty 100

## 2019-05-06 MED ORDER — MIDAZOLAM HCL 2 MG/2ML IJ SOLN
INTRAMUSCULAR | Status: AC
  Filled 2019-05-06: qty 2

## 2019-05-06 MED ORDER — FENTANYL CITRATE (PF) 100 MCG/2ML IJ SOLN
INTRAMUSCULAR | Status: AC
  Administered 2019-05-06: 50 ug via INTRAVENOUS
  Filled 2019-05-06: qty 2

## 2019-05-06 MED ORDER — LACTATED RINGERS IR SOLN
Status: DC | PRN
  Administered 2019-05-06: 1000 mL

## 2019-05-06 MED ORDER — FENTANYL CITRATE (PF) 100 MCG/2ML IJ SOLN
25.0000 ug | INTRAMUSCULAR | Status: DC | PRN
  Administered 2019-05-06 (×2): 25 ug via INTRAVENOUS
  Administered 2019-05-06 (×2): 50 ug via INTRAVENOUS

## 2019-05-06 MED ORDER — GABAPENTIN 300 MG PO CAPS
300.0000 mg | ORAL_CAPSULE | ORAL | Status: AC
  Administered 2019-05-06: 300 mg via ORAL
  Filled 2019-05-06: qty 1

## 2019-05-06 MED ORDER — BUPIVACAINE HCL (PF) 0.5 % IJ SOLN
INTRAMUSCULAR | Status: AC
  Filled 2019-05-06: qty 30

## 2019-05-06 MED ORDER — LIDOCAINE 2% (20 MG/ML) 5 ML SYRINGE
INTRAMUSCULAR | Status: DC | PRN
  Administered 2019-05-06: 80 mg via INTRAVENOUS

## 2019-05-06 MED ORDER — PROPOFOL 10 MG/ML IV BOLUS
INTRAVENOUS | Status: DC | PRN
  Administered 2019-05-06: 180 mg via INTRAVENOUS

## 2019-05-06 MED ORDER — SUGAMMADEX SODIUM 200 MG/2ML IV SOLN
INTRAVENOUS | Status: DC | PRN
  Administered 2019-05-06 (×2): 200 mg via INTRAVENOUS

## 2019-05-06 MED ORDER — METOCLOPRAMIDE HCL 5 MG/ML IJ SOLN: 10.0000 mg | Freq: Once | INTRAMUSCULAR | Status: DC | PRN

## 2019-05-06 MED ORDER — FENTANYL CITRATE (PF) 250 MCG/5ML IJ SOLN
INTRAMUSCULAR | Status: AC
  Filled 2019-05-06: qty 5

## 2019-05-06 MED ORDER — ROCURONIUM BROMIDE 10 MG/ML (PF) SYRINGE
PREFILLED_SYRINGE | INTRAVENOUS | Status: DC | PRN
  Administered 2019-05-06: 90 mg via INTRAVENOUS

## 2019-05-06 MED ORDER — MEPERIDINE HCL 50 MG/ML IJ SOLN: 6.2500 mg | INTRAMUSCULAR | Status: DC | PRN

## 2019-05-06 MED ORDER — SUGAMMADEX SODIUM 200 MG/2ML IV SOLN
INTRAVENOUS | Status: AC
  Filled 2019-05-06: qty 2

## 2019-05-06 MED ORDER — ONDANSETRON HCL 4 MG/2ML IJ SOLN
INTRAMUSCULAR | Status: DC | PRN
  Administered 2019-05-06: 4 mg via INTRAVENOUS

## 2019-05-06 MED ORDER — OXYCODONE HCL 5 MG PO TABS: 5.0000 mg | ORAL_TABLET | Freq: Four times a day (QID) | ORAL | 0 refills | Status: DC | PRN

## 2019-05-06 MED ORDER — ACETAMINOPHEN 500 MG PO TABS
1000.0000 mg | ORAL_TABLET | ORAL | Status: AC
  Administered 2019-05-06: 1000 mg via ORAL
  Filled 2019-05-06: qty 2

## 2019-05-06 MED ORDER — DEXAMETHASONE SODIUM PHOSPHATE 10 MG/ML IJ SOLN
INTRAMUSCULAR | Status: DC | PRN
  Administered 2019-05-06: 8 mg via INTRAVENOUS

## 2019-05-06 MED ORDER — KETAMINE HCL 10 MG/ML IJ SOLN
INTRAMUSCULAR | Status: AC
  Filled 2019-05-06: qty 1

## 2019-05-06 MED ORDER — DEXAMETHASONE SODIUM PHOSPHATE 10 MG/ML IJ SOLN
INTRAMUSCULAR | Status: AC
  Filled 2019-05-06: qty 1

## 2019-05-06 MED ORDER — SUCCINYLCHOLINE CHLORIDE 200 MG/10ML IV SOSY
PREFILLED_SYRINGE | INTRAVENOUS | Status: AC
  Filled 2019-05-06: qty 10

## 2019-05-06 MED ORDER — 0.9 % SODIUM CHLORIDE (POUR BTL) OPTIME
TOPICAL | Status: DC | PRN
  Administered 2019-05-06: 1000 mL

## 2019-05-06 MED ORDER — KETAMINE HCL 10 MG/ML IJ SOLN
INTRAMUSCULAR | Status: DC | PRN
  Administered 2019-05-06: 40 mg via INTRAVENOUS

## 2019-05-06 MED ORDER — LIDOCAINE 2% (20 MG/ML) 5 ML SYRINGE
INTRAMUSCULAR | Status: DC | PRN
  Administered 2019-05-06: 1.5 mg/kg/h via INTRAVENOUS

## 2019-05-06 MED ORDER — LACTATED RINGERS IV SOLN
INTRAVENOUS | Status: DC
  Administered 2019-05-06: 07:00:00 via INTRAVENOUS

## 2019-05-06 MED ORDER — PROPOFOL 10 MG/ML IV BOLUS
INTRAVENOUS | Status: AC
  Filled 2019-05-06: qty 40

## 2019-05-06 MED ORDER — ONDANSETRON HCL 4 MG/2ML IJ SOLN
INTRAMUSCULAR | Status: AC
  Filled 2019-05-06: qty 2

## 2019-05-06 SURGICAL SUPPLY — 43 items
ADH SKN CLS APL DERMABOND .7 (GAUZE/BANDAGES/DRESSINGS) ×1
APL PRP STRL LF DISP 70% ISPRP (MISCELLANEOUS) ×1
APPLIER CLIP 5 13 M/L LIGAMAX5 (MISCELLANEOUS) ×3
APR CLP MED LRG 5 ANG JAW (MISCELLANEOUS) ×1
BAG SPEC RTRVL LRG 6X4 10 (ENDOMECHANICALS) ×1
CABLE HIGH FREQUENCY MONO STRZ (ELECTRODE) ×3 IMPLANT
CHLORAPREP W/TINT 26 (MISCELLANEOUS) ×3 IMPLANT
CLIP APPLIE 5 13 M/L LIGAMAX5 (MISCELLANEOUS) ×1 IMPLANT
COVER MAYO STAND STRL (DRAPES) IMPLANT
COVER WAND RF STERILE (DRAPES) IMPLANT
DECANTER SPIKE VIAL GLASS SM (MISCELLANEOUS) ×3 IMPLANT
DERMABOND ADVANCED (GAUZE/BANDAGES/DRESSINGS) ×2
DERMABOND ADVANCED .7 DNX12 (GAUZE/BANDAGES/DRESSINGS) ×1 IMPLANT
DRAPE C-ARM 42X120 X-RAY (DRAPES) IMPLANT
ELECT REM PT RETURN 15FT ADLT (MISCELLANEOUS) ×3 IMPLANT
GLOVE BIOGEL PI IND STRL 6.5 (GLOVE) IMPLANT
GLOVE BIOGEL PI IND STRL 7.0 (GLOVE) IMPLANT
GLOVE BIOGEL PI INDICATOR 6.5 (GLOVE) ×2
GLOVE BIOGEL PI INDICATOR 7.0 (GLOVE) ×4
GLOVE ECLIPSE 6.5 STRL STRAW (GLOVE) ×2 IMPLANT
GLOVE SURG SIGNA 7.5 PF LTX (GLOVE) ×3 IMPLANT
GLOVE SURG SS PI 7.0 STRL IVOR (GLOVE) ×2 IMPLANT
GOWN STRL REUS W/ TWL LRG LVL3 (GOWN DISPOSABLE) IMPLANT
GOWN STRL REUS W/TWL LRG LVL3 (GOWN DISPOSABLE) ×3
GOWN STRL REUS W/TWL XL LVL3 (GOWN DISPOSABLE) ×6 IMPLANT
HEMOSTAT SNOW SURGICEL 2X4 (HEMOSTASIS) ×2 IMPLANT
HEMOSTAT SURGICEL 4X8 (HEMOSTASIS) IMPLANT
KIT BASIN OR (CUSTOM PROCEDURE TRAY) ×3 IMPLANT
KIT TURNOVER KIT A (KITS) ×2 IMPLANT
PENCIL HANDSWITCHING (ELECTRODE) ×2 IMPLANT
POUCH SPECIMEN RETRIEVAL 10MM (ENDOMECHANICALS) ×3 IMPLANT
SCISSORS LAP 5X35 DISP (ENDOMECHANICALS) ×3 IMPLANT
SET CHOLANGIOGRAPH MIX (MISCELLANEOUS) IMPLANT
SET IRRIG TUBING LAPAROSCOPIC (IRRIGATION / IRRIGATOR) ×3 IMPLANT
SET TUBE SMOKE EVAC HIGH FLOW (TUBING) ×3 IMPLANT
SLEEVE XCEL OPT CAN 5 100 (ENDOMECHANICALS) ×6 IMPLANT
SUT MNCRL AB 4-0 PS2 18 (SUTURE) ×3 IMPLANT
SUT PDS AB 1 CT1 27 (SUTURE) ×2 IMPLANT
TOWEL OR 17X26 10 PK STRL BLUE (TOWEL DISPOSABLE) ×3 IMPLANT
TOWEL OR NON WOVEN STRL DISP B (DISPOSABLE) ×3 IMPLANT
TRAY LAPAROSCOPIC (CUSTOM PROCEDURE TRAY) ×3 IMPLANT
TROCAR BLADELESS OPT 5 100 (ENDOMECHANICALS) ×3 IMPLANT
TROCAR XCEL BLUNT TIP 100MML (ENDOMECHANICALS) ×3 IMPLANT

## 2019-05-06 NOTE — Anesthesia Procedure Notes (Signed)
Procedure Name: Intubation Date/Time: 05/06/2019 8:39 AM Performed by: Eben Burow, CRNA Pre-anesthesia Checklist: Patient identified, Emergency Drugs available, Suction available, Patient being monitored and Timeout performed Patient Re-evaluated:Patient Re-evaluated prior to induction Oxygen Delivery Method: Circle system utilized Preoxygenation: Pre-oxygenation with 100% oxygen Induction Type: IV induction and Rapid sequence Laryngoscope Size: Mac and 4 Grade View: Grade I Tube type: Oral Tube size: 7.5 mm Number of attempts: 1 Airway Equipment and Method: Stylet Placement Confirmation: ETT inserted through vocal cords under direct vision,  positive ETCO2 and breath sounds checked- equal and bilateral Secured at: 20 cm Tube secured with: Tape Dental Injury: Teeth and Oropharynx as per pre-operative assessment

## 2019-05-06 NOTE — Interval H&P Note (Signed)
History and Physical Interval Note:no change in H and P  05/06/2019 8:09 AM  Joseph Mccall  has presented today for surgery, with the diagnosis of biliary dyskinesia.  The various methods of treatment have been discussed with the patient and family. After consideration of risks, benefits and other options for treatment, the patient has consented to  Procedure(s): LAPAROSCOPIC CHOLECYSTECTOMY (N/A) as a surgical intervention.  The patient's history has been reviewed, patient examined, no change in status, stable for surgery.  I have reviewed the patient's chart and labs.  Questions were answered to the patient's satisfaction.     Coralie Keens

## 2019-05-06 NOTE — Transfer of Care (Signed)
Immediate Anesthesia Transfer of Care Note  Patient: Joseph Mccall  Procedure(s) Performed: LAPAROSCOPIC CHOLECYSTECTOMY (N/A Abdomen)  Patient Location: PACU  Anesthesia Type:General  Level of Consciousness: awake, alert  and oriented  Airway & Oxygen Therapy: Patient Spontanous Breathing and Patient connected to face mask oxygen  Post-op Assessment: Report given to RN and Post -op Vital signs reviewed and stable  Post vital signs: Reviewed and stable  Last Vitals:  Vitals Value Taken Time  BP    Temp    Pulse 74 05/06/2019  9:26 AM  Resp 23 05/06/2019  9:26 AM  SpO2 96 % 05/06/2019  9:26 AM  Vitals shown include unvalidated device data.  Last Pain:  Vitals:   05/06/19 0702  TempSrc:   PainSc: 0-No pain         Complications: No apparent anesthesia complications

## 2019-05-06 NOTE — Discharge Instructions (Signed)
[facility] ______[facility] LAPAROSCOPIC SURGERY: POST OP INSTRUCTIONS Always review your discharge instruction sheet given to you by the facility where your surgery was performed. IF YOU HAVE DISABILITY OR FAMILY LEAVE FORMS, YOU MUST BRING THEM TO THE OFFICE FOR PROCESSING.   DO NOT GIVE THEM TO YOUR DOCTOR.  1. A prescription for pain medication may be given to you upon discharge.  Take your pain medication as prescribed, if needed.  If narcotic pain medicine is not needed, then you may take acetaminophen (Tylenol) or ibuprofen (Advil) as needed. 2. Take your usually prescribed medications unless otherwise directed. 3. If you need a refill on your pain medication, please contact your pharmacy.  They will contact our office to request authorization. Prescriptions will not be filled after 5pm or on week-ends. 4. You should follow a light diet the first few days after arrival home, such as soup and crackers, etc.  Be sure to include lots of fluids daily. 5. Most patients will experience some swelling and bruising in the area of the incisions.  Ice packs will help.  Swelling and bruising can take several days to resolve.  6. It is common to experience some constipation if taking pain medication after surgery.  Increasing fluid intake and taking a stool softener (such as Colace) will usually help or prevent this problem from occurring.  A mild laxative (Milk of Magnesia or Miralax) should be taken according to package instructions if there are no bowel movements after 48 hours. 7. Unless discharge instructions indicate otherwise, you may remove your bandages 24-48 hours after surgery, and you may shower at that time.  You may have steri-strips (small skin tapes) in place directly over the incision.  These strips should be left on the skin for 7-10 days.  If your surgeon used skin glue on the incision, you may shower in 24 hours.  The glue will flake off over the next 2-3 weeks.  Any sutures or  staples will be removed at the office during your follow-up visit. 8. ACTIVITIES:  You may resume regular (light) daily activities beginning the next day--such as daily self-care, walking, climbing stairs--gradually increasing activities as tolerated.  You may have sexual intercourse when it is comfortable.  Refrain from any heavy lifting or straining until approved by your doctor. a. You may drive when you are no longer taking prescription pain medication, you can comfortably wear a seatbelt, and you can safely maneuver your car and apply brakes. b. RETURN TO WORK:  __________________________________________________________ 9. You should see your doctor in the office for a follow-up appointment approximately 2-3 weeks after your surgery.  Make sure that you call for this appointment within a day or two after you arrive home to insure a convenient appointment time. 10. OTHER INSTRUCTIONS:NO LIFTING MORE THAN 15 POUNDS FOR 3 WEEKS 11. ICE PACK, TYLENOL, IBUPROFEN ALSO FOR PAIN __________________________________________________________________________________________________________________________ __________________________________________________________________________________________________________________________ WHEN TO CALL YOUR DOCTOR: 1. Fever over 101.0 2. Inability to urinate 3. Continued bleeding from incision. 4. Increased pain, redness, or drainage from the incision. 5. Increasing abdominal pain  The clinic staff is available to answer your questions during regular business hours.  Please dont hesitate to call and ask to speak to one of the nurses for clinical concerns.  If you have a medical emergency, go to the nearest emergency room or call 911.  A surgeon from [facility] is always on call at the hospital. [address] [place], [place], [place]  [address] ? [address]  [address] [place], [place]   [address] [phone] ? [phone] ? FAX [phone] Web site:  [email or web address]

## 2019-05-06 NOTE — Op Note (Signed)
Laparoscopic Cholecystectomy Procedure Note  Indications: This patient presents with symptomatic gallbladder disease and will undergo laparoscopic cholecystectomy.  Pre-operative Diagnosis: biliary dyskinesia, chronic cholecystitis  Post-operative Diagnosis: Same, cirrhosis  Surgeon: Coralie Keens   Assistants: 0  Anesthesia: General endotracheal anesthesia  ASA Class: 2  Procedure Details  The patient was seen again in the Holding Room. The risks, benefits, complications, treatment options, and expected outcomes were discussed with the patient. The possibilities of reaction to medication, pulmonary aspiration, perforation of viscus, bleeding, recurrent infection, finding a normal gallbladder, the need for additional procedures, failure to diagnose a condition, the possible need to convert to an open procedure, and creating a complication requiring transfusion or operation were discussed with the patient. The likelihood of improving the patient's symptoms with return to their baseline status is good.  The patient and/or family concurred with the proposed plan, giving informed consent. The site of surgery properly noted. The patient was taken to Operating Room, identified as Joseph Mccall and the procedure verified as Laparoscopic Cholecystectomy with Intraoperative Cholangiogram. A Time Out was held and the above information confirmed.  Prior to the induction of general anesthesia, antibiotic prophylaxis was administered. General endotracheal anesthesia was then administered and tolerated well. After the induction, the abdomen was prepped with Chloraprep and draped in sterile fashion. The patient was positioned in the supine position.  Local anesthetic agent was injected into the skin near the umbilicus and an incision made. We dissected down to the abdominal fascia with blunt dissection.  The fascia was incised vertically at an umbilical hernia and we entered the peritoneal cavity bluntly.   A pursestring suture of 0-Vicryl was placed around the fascial opening.  The Hasson cannula was inserted and secured with the stay suture.  Pneumoperitoneum was then created with CO2 and tolerated well without any adverse changes in the patient's vital signs. A 5-mm port was placed in the subxiphoid position.  Two 5-mm ports were placed in the right upper quadrant. All skin incisions were infiltrated with a local anesthetic agent before making the incision and placing the trocars.   We positioned the patient in reverse Trendelenburg, tilted slightly to the patient's left. The patient had a cirrhotic appearing liver. The gallbladder was identified, the fundus grasped and retracted cephalad. Adhesions were lysed bluntly and with the electrocautery where indicated, taking care not to injure any adjacent organs or viscus. The infundibulum was grasped and retracted laterally, exposing the peritoneum overlying the triangle of Calot. This was then divided and exposed in a blunt fashion. The cystic duct was clearly identified and bluntly dissected circumferentially. A critical view of the cystic duct and cystic artery was obtained.  The cystic duct was then ligated with clips and divided. The cystic artery was, dissected free, ligated with clips and divided as well.   The gallbladder was dissected from the liver bed in retrograde fashion with the electrocautery. The gallbladder was removed and placed in an Endocatch sac. The liver bed was irrigated and inspected. Hemostasis was achieved with the electrocautery. Copious irrigation was utilized and was repeatedly aspirated until clear.  The gallbladder and Endocatch sac were then removed through the umbilical port site.  The pursestring suture was used to close the umbilical fascia.  I reinforced this with a #1 prolene suture.  We again inspected the right upper quadrant for hemostasis.  Pneumoperitoneum was released as we removed the trocars.  4-0 Monocryl was used to  close the skin.   Skin glue was then applied.  The patient was then extubated and brought to the recovery room in stable condition. Instrument, sponge, and needle counts were correct at closure and at the conclusion of the case.   Findings: Cholecystitis without Cholelithiasis Cirrhosis of the liver Estimated Blood Loss: Minimal         Drains: none         Specimens: Gallbladder           Complications: None; patient tolerated the procedure well.         Disposition: PACU - hemodynamically stable.         Condition: stable

## 2019-05-06 NOTE — Anesthesia Postprocedure Evaluation (Signed)
Anesthesia Post Note  Patient: Joseph Mccall  Procedure(s) Performed: LAPAROSCOPIC CHOLECYSTECTOMY (N/A Abdomen)     Patient location during evaluation: PACU Anesthesia Type: General Level of consciousness: awake and alert Pain management: pain level controlled Vital Signs Assessment: post-procedure vital signs reviewed and stable Respiratory status: spontaneous breathing, nonlabored ventilation, respiratory function stable and patient connected to nasal cannula oxygen Cardiovascular status: blood pressure returned to baseline and stable Postop Assessment: no apparent nausea or vomiting Anesthetic complications: no    Last Vitals:  Vitals:   05/06/19 1030 05/06/19 1045  BP: (!) 153/73 (!) 157/81  Pulse: 70 78  Resp: 16   Temp:  36.8 C  SpO2: 92% 95%    Last Pain:  Vitals:   05/06/19 1045  TempSrc:   PainSc: 3                  Montez Hageman

## 2019-05-07 ENCOUNTER — Encounter (HOSPITAL_COMMUNITY): Payer: Self-pay | Admitting: Surgery

## 2019-06-04 ENCOUNTER — Telehealth: Payer: Self-pay | Admitting: Gastroenterology

## 2019-06-04 NOTE — Telephone Encounter (Signed)
Prescreen complete 

## 2019-06-04 NOTE — Telephone Encounter (Signed)
Patient return call. ?

## 2019-06-07 ENCOUNTER — Encounter: Payer: Self-pay | Admitting: Gastroenterology

## 2019-06-07 ENCOUNTER — Ambulatory Visit (INDEPENDENT_AMBULATORY_CARE_PROVIDER_SITE_OTHER): Payer: Medicare Other | Admitting: Gastroenterology

## 2019-06-07 ENCOUNTER — Other Ambulatory Visit: Payer: Self-pay

## 2019-06-07 VITALS — Ht 69.0 in | Wt 244.0 lb

## 2019-06-07 DIAGNOSIS — R197 Diarrhea, unspecified: Secondary | ICD-10-CM

## 2019-06-07 DIAGNOSIS — R1013 Epigastric pain: Secondary | ICD-10-CM

## 2019-06-07 DIAGNOSIS — K648 Other hemorrhoids: Secondary | ICD-10-CM

## 2019-06-07 DIAGNOSIS — K760 Fatty (change of) liver, not elsewhere classified: Secondary | ICD-10-CM

## 2019-06-07 NOTE — Progress Notes (Signed)
    History of Present Illness: This is a 68 year old male with several complaints including return of diarrhea, persistent mild weight loss, intermittent small-volume hematochezia, occasional epigastric pain.  Since December he has undergone an extensive evaluation including CT AP,  RUQ [place], HIDA, EGD, colonoscopy. Lap chole performed in early May for biliary dyskinesia, chronic cholecystitis.  Since his cholecystectomy his upper abdominal pain has substantially improved.  He has noted a return of diarrhea since his cholecystectomy.  He discontinued alcohol use and he has noted about a 10# weight loss over the past 1-2 months.  He notes a small amounts of red blood per rectum intermittently with bowel movements.  Current Medications, Allergies, Past Medical History, Past Surgical History, Family History and Social History were reviewed in [employer] record.   Physical Exam: Telemedicine - not performed   Assessment and Recommendations:  1.  Chronic cholecystitis status post laparoscopic cholecystectomy.  Follow-up with Dr. Ninfa Linden this week as planned.  2.  Diarrhea, likely postcholecystectomy and functional.  Resume Restora.  Imodium 3 times daily as needed.  Advised to review post cholecystectomy diarrhea management with Dr. Ninfa Linden. Avoid foods, beverages that exacerbate diarrhea.   3. Weight loss.  Patient reassured that he has had a thorough gastrointestinal evaluation over the past few months and there is no immediate concern about his weight loss except that it is optimal for his long-term health to continue weight loss given his BMI of 36.  No further GI evaluation necessary at this time. If weight loss seems to be abnormal he is advised to contact his PCP.  Follow-up with PCP.  4.  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  Continue pantoprazole 40 mg daily.  Dicyclomine not helpful. Begin FDgard 1-2 po tid  prn. REV in 1 years.   5.  Intermittent small-volume hematochezia due to hemorrhoids.  Preparation H suppositories PR daily for 3 to 5 days when hemorrhoid symptoms flare.  Offered hemorrhoidal banding for more definitive therapy.  He states he will consider hemorrhoidal banding and call if he wishes to proceed.  6. Hepatic steatosis. Long term carb modified, fat modified, weight loss diet supervised by his PCP.  Please send him information from office on fatty liver disease and long-term management of follow-up.  7.  Personal history of adenomatous colon polyps.  A 5-year interval surveillance colonoscopy is recommended in January 2025.  8.  Colonic AVMs, asymptomatic.   These services were provided via telemedicine, audio and video.  The patient was at home and the provider was in the office, alone.  We discussed the limitations of evaluation and management by telemedicine and the availability of in person appointments.  Patient consented for this telemedicine visit and is aware of possible charges for this service.  Office CMA or LPN participated in this telemedicine service.  Time spent on call: 19 minutes

## 2019-06-07 NOTE — Patient Instructions (Signed)
Resume Restora.  Imodium -1 by mouth three times daily as needed for diarrhea.   Begin FDgard 1-2 by mouth three times daily as needed.   Preparation H (over the counter) 1 per rectum daily for 3 to 5 days when hemorrhoid symptoms flare.   .Thank you for choosing me and [facility].  Pricilla Riffle. Dagoberto Ligas., MD., Marval Regal

## 2019-12-21 ENCOUNTER — Ambulatory Visit: Payer: Medicare Other | Admitting: Podiatry

## 2019-12-21 ENCOUNTER — Other Ambulatory Visit: Payer: Self-pay

## 2019-12-21 DIAGNOSIS — L600 Ingrowing nail: Secondary | ICD-10-CM | POA: Diagnosis not present

## 2019-12-21 MED ORDER — NEOMYCIN-POLYMYXIN-HC 1 % OT SOLN: OTIC | 1 refills | Status: DC

## 2019-12-21 NOTE — Progress Notes (Signed)
Subjective:  Patient ID: Joseph Mccall, male    DOB: [birth year],  MRN: [id number] HPI Chief Complaint  Patient presents with  . Nail Problem    Right 1st toenail medial border ingrown, 1 month duration, denies drainage. Pt states "My nails have an ingrowing pattern".     68 y.o. male presents with the above complaint.   ROS: Denies fever chills nausea vomiting muscle aches pains calf pain back pain chest pain shortness of breath.  Past Medical History:  Diagnosis Date  . Abnormal transaminases   . Anxiety and depression   . Arthralgia   . Arthritis    knees  . Bilateral inguinal hernia 06/03/2018  . Cataract    removed both eyes  . Chest pain   . Diastolic dysfunction 0000000   Moderate, Noted on ECHO  . Diverticulosis 01/04/2019   Mild, left colon  . Fatty liver 12/07/2018   Mild, Noted on CT Abd  . GERD (gastroesophageal reflux disease)   . Glucose intolerance (impaired glucose tolerance)   . History of colonic polyps   . Hyperlipidemia   . Hypothyroidism    No medication  . Insomnia   . Irregular heart beat    reduced caffefine intake has helped  . Knee pain, bilateral   . LVH (left ventricular hypertrophy) 12/12/2015   Mild, noted ECHO   . Malaise and fatigue   . Obesity   . Sleep apnea    no cpap-last sleep test di dnot indicate sleep apnea  . Tinnitus   . Umbilical hernia [id number]  . Urinary hesitancy    Past Surgical History:  Procedure Laterality Date  . CATARACT EXTRACTION, BILATERAL    . CHOLECYSTECTOMY N/A 05/06/2019   Procedure: LAPAROSCOPIC CHOLECYSTECTOMY;  Surgeon: Coralie Keens, MD;  Location: [facility];  Service: General;  Laterality: N/A;  . COLON BIOPSY    . colon polyp removal    . COLONOSCOPY W/ POLYPECTOMY  01/04/2019  . POLYPECTOMY    . UPPER GI ENDOSCOPY  01/04/2019    Current Outpatient Medications:  .  baclofen (LIORESAL) 10 MG tablet, Take 10 mg by mouth every 8 (eight) hours., Disp: , Rfl:  .  Ciclopirox (LOPROX) 1 %  shampoo, Apply 1 application topically daily as needed (rash). , Disp: , Rfl:  .  diazepam (VALIUM) 5 MG tablet, Take 5 mg by mouth every 6 (six) hours as needed for anxiety., Disp: , Rfl:  .  dicyclomine (BENTYL) 10 MG capsule, Take 1 capsule (10 mg total) by mouth 3 (three) times daily as needed for spasms., Disp: 60 capsule, Rfl: 2 .  diphenhydramine-acetaminophen (TYLENOL PM) 25-500 MG TABS tablet, Take 1 tablet by mouth at bedtime as needed., Disp: , Rfl:  .  famotidine-calcium carbonate-magnesium hydroxide (PEPCID COMPLETE) 10-800-165 MG chewable tablet, Chew 1 tablet by mouth 2 (two) times a day., Disp: , Rfl:  .  mupirocin ointment (BACTROBAN) 2 %, APPLY A THIN COAT TO RIGHT GREAT TOE TWICE A DAY FOR 1 WEEK MAY REPEAT IF NEEDED, Disp: , Rfl:  .  pantoprazole (PROTONIX) 40 MG tablet, Take 40 mg by mouth daily., Disp: , Rfl:  .  Probiotic Product (RESTORA PO), Take 1 tablet by mouth daily., Disp: , Rfl:  .  traMADol (ULTRAM) 50 MG tablet, Take 1 tablet (50 mg total) by mouth 3 (three) times daily as needed (abdominal pain)., Disp: 20 tablet, Rfl: 0 .  NEOMYCIN-POLYMYXIN-HYDROCORTISONE (CORTISPORIN) 1 % SOLN OTIC solution, Apply 1-2 drops to toe BID after soaking, Disp:  10 mL, Rfl: 1  No Known Allergies Review of Systems Objective:  There were no vitals filed for this visit.  General: Well developed, nourished, in no acute distress, alert and oriented x3   Dermatological: Skin is warm, dry and supple bilateral. Nails x 10 are well maintained; remaining integument appears unremarkable at this time. There are no open sores, no preulcerative lesions, no rash or signs of infection present.  Onychocryptosis with mild to moderate erythema no purulence no malodor tibiofibular border of the hallux right.  The toe is painful on palpation.  Vascular: Dorsalis Pedis artery and Posterior Tibial artery pedal pulses are 2/4 bilateral with immedate capillary fill time. Pedal hair growth present. No  varicosities and no lower extremity edema present bilateral.   Neruologic: Grossly intact via light touch bilateral. Vibratory intact via tuning fork bilateral. Protective threshold with Semmes Wienstein monofilament intact to all pedal sites bilateral. Patellar and Achilles deep tendon reflexes 2+ bilateral. No Babinski or clonus noted bilateral.   Musculoskeletal: No gross boney pedal deformities bilateral. No pain, crepitus, or limitation noted with foot and ankle range of motion bilateral. Muscular strength 5/5 in all groups tested bilateral.  Gait: Unassisted, Nonantalgic.    Radiographs:  None taken  Assessment & Plan:   Assessment: Ingrown toenail tibial and fibular border of the hallux right.  Plan: Chemical matrixectomy was performed tibiofibular border the hallux right after local anesthetic was administered.  He tolerated the procedure well without complications.  He was provided with both oral and written home-going instructions for the care and soaking of his toe.  He was also provided with a prescription for Corticosporin otic to be applied twice daily after soaking.     [employer] [address] [place], [place]

## 2019-12-21 NOTE — Patient Instructions (Signed)

## 2020-01-04 ENCOUNTER — Other Ambulatory Visit: Payer: Self-pay

## 2020-01-04 ENCOUNTER — Ambulatory Visit: Payer: Medicare Other | Admitting: Podiatry

## 2020-01-04 ENCOUNTER — Encounter: Payer: Self-pay | Admitting: Podiatry

## 2020-01-04 DIAGNOSIS — L600 Ingrowing nail: Secondary | ICD-10-CM

## 2020-01-04 DIAGNOSIS — Z9889 Other specified postprocedural states: Secondary | ICD-10-CM

## 2020-01-05 NOTE — Progress Notes (Signed)
He presents today for follow-up of his matrixectomy hallux right states that is doing better have no problems continue to soak it and I continue to put the drops on it.  Has not been putting a Band-Aid across it since there has been no drainage.  Objective: Vital signs are stable he is alert and oriented x3 hallux right medial and lateral matrixectomy's appear to be healing well there is a scab present minimal erythema no cellulitis drainage or odor.  Assessment: Well-healing matrixectomy.  Plan: Follow-up with me as needed.

## 2020-03-08 ENCOUNTER — Other Ambulatory Visit: Payer: Self-pay | Admitting: Gastroenterology

## 2020-04-17 ENCOUNTER — Encounter: Payer: Self-pay | Admitting: Gastroenterology

## 2020-04-17 ENCOUNTER — Ambulatory Visit: Payer: Medicare Other | Admitting: Gastroenterology

## 2020-04-17 VITALS — BP 118/64 | HR 73 | Temp 97.5°F | Ht 69.0 in | Wt 250.4 lb

## 2020-04-17 DIAGNOSIS — R1013 Epigastric pain: Secondary | ICD-10-CM

## 2020-04-17 DIAGNOSIS — G8929 Other chronic pain: Secondary | ICD-10-CM

## 2020-04-17 DIAGNOSIS — R197 Diarrhea, unspecified: Secondary | ICD-10-CM

## 2020-04-17 MED ORDER — PANTOPRAZOLE SODIUM 40 MG PO TBEC: 40.0000 mg | DELAYED_RELEASE_TABLET | Freq: Every day | ORAL | 11 refills | Status: DC

## 2020-04-17 MED ORDER — DICYCLOMINE HCL 20 MG PO TABS: 20.0000 mg | ORAL_TABLET | Freq: Three times a day (TID) | ORAL | 11 refills | Status: AC | PRN

## 2020-04-17 NOTE — Progress Notes (Signed)
    History of Present Illness: This is a 69 year old male who has had intermittent difficulties with epigastric pain.  His symptoms are similar to symptoms in the past.  Symptoms frequently respond to dicyclomine however sometimes it is a partial response and sometimes no response.  He has not taken Pepcid complete to attempt to improve symptoms for many months.  EGD performed in January 2020 showed mild chronic gastritis, without H. pylori, without intestinal metaplasia.   Current Medications, Allergies, Past Medical History, Past Surgical History, Family History and Social History were reviewed in [employer] record.   Physical Exam: General: Well developed, well nourished, no acute distress Head: Normocephalic and atraumatic Eyes:  sclerae anicteric, EOMI Ears: Normal auditory acuity Mouth: Not examined, mask on during Covid-19 pandemic Lungs: Clear throughout to auscultation Heart: Regular rate and rhythm; no murmurs, rubs or bruits Abdomen: Soft, mild diffuse tenderness and non distended. No masses, hepatosplenomegaly or hernias noted. Normal Bowel sounds Rectal: Not done Musculoskeletal: Symmetrical with no gross deformities  Pulses:  Normal pulses noted Extremities: No clubbing, cyanosis, edema or deformities noted Neurological: Alert oriented x 4, grossly nonfocal Psychological:  Alert and cooperative. Normal mood and affect   Assessment and Recommendations:  1. Epigiastric pain.  Dyspepsia.  Increase dicyclomine to 20 mg p.o. 3 times daily as needed.  Continue pantoprazole 40 mg daily.  Resume Pepcid Complete twice daily as needed for symptoms that do not respond to dicyclomine.  Patient is advised to call if symptoms not well controlled.  Consider trial of FDgard if symptoms not controlled.  REV in 1 year.   2.  Diarrhea, postcholecystectomy and functional.  Restora control symptoms very well.  Continue restorative.  Imodium 3 times daily as needed if  needed.  3.  History of adenomatous colon polyps.  A 5-year interval surveillance colonoscopy is recommended in January 2025.  4.  Hepatic steatosis.  Long-term carb modified, fat modified, weight loss diet supervised by his PCP. BMI=37.  5.  Colonic AVMs, asymptomatic.

## 2020-04-17 NOTE — Patient Instructions (Signed)
Start over the counter pepcid complete twice daily as needed.   We have sent the following medications to your pharmacy for you to pick up at your convenience: pantoprazole 40 mg daily and dicyclomine 20 mg three times a day as needed.   Normal BMI (Body Mass Index- based on height and weight) is between 23 and 30. Your BMI today is Body mass index is 36.97 kg/m. Marland Kitchen Please consider follow up  regarding your BMI with your Primary Care Provider.  Thank you for choosing me and [facility].  Pricilla Riffle. Dagoberto Ligas., MD., Marval Regal

## 2020-07-18 ENCOUNTER — Telehealth: Payer: Self-pay | Admitting: Oncology

## 2020-07-18 NOTE — Telephone Encounter (Signed)
Received an urgent new hem referral from Dr. Forde Dandy for  leukopenia/ thrombocytopenia/ polycythemia. Mr. Joseph Mccall has been cld and scheduled to see Dr. Alen Blew on 7/21 at 11am. Pt awareto arrive 15 minutes early.

## 2020-07-19 ENCOUNTER — Other Ambulatory Visit: Payer: Self-pay

## 2020-07-19 ENCOUNTER — Inpatient Hospital Stay: Payer: Medicare Other | Attending: Oncology | Admitting: Oncology

## 2020-07-19 VITALS — BP 140/73 | HR 65 | Temp 98.1°F | Resp 18 | Ht 69.0 in | Wt 246.7 lb

## 2020-07-19 DIAGNOSIS — E669 Obesity, unspecified: Secondary | ICD-10-CM | POA: Diagnosis not present

## 2020-07-19 DIAGNOSIS — K219 Gastro-esophageal reflux disease without esophagitis: Secondary | ICD-10-CM | POA: Insufficient documentation

## 2020-07-19 DIAGNOSIS — D45 Polycythemia vera: Secondary | ICD-10-CM

## 2020-07-19 DIAGNOSIS — D751 Secondary polycythemia: Secondary | ICD-10-CM | POA: Diagnosis present

## 2020-07-19 DIAGNOSIS — E039 Hypothyroidism, unspecified: Secondary | ICD-10-CM | POA: Diagnosis not present

## 2020-07-19 DIAGNOSIS — G4730 Sleep apnea, unspecified: Secondary | ICD-10-CM | POA: Insufficient documentation

## 2020-07-19 DIAGNOSIS — D696 Thrombocytopenia, unspecified: Secondary | ICD-10-CM | POA: Diagnosis not present

## 2020-07-19 DIAGNOSIS — F419 Anxiety disorder, unspecified: Secondary | ICD-10-CM | POA: Diagnosis not present

## 2020-07-19 DIAGNOSIS — Z79899 Other long term (current) drug therapy: Secondary | ICD-10-CM | POA: Insufficient documentation

## 2020-07-19 DIAGNOSIS — Z87891 Personal history of nicotine dependence: Secondary | ICD-10-CM | POA: Insufficient documentation

## 2020-07-19 DIAGNOSIS — E785 Hyperlipidemia, unspecified: Secondary | ICD-10-CM | POA: Diagnosis not present

## 2020-07-19 DIAGNOSIS — D72819 Decreased white blood cell count, unspecified: Secondary | ICD-10-CM | POA: Insufficient documentation

## 2020-07-19 NOTE — Progress Notes (Signed)
Reason for the request:    Erythrocytosis  HPI: I was asked by Dr. Forde Dandy to evaluate Mr. Joseph Mccall for an abnormal CBC.  He is a 69 year old man with history of hyperlipidemia, hypothyroidism chronic abdominal pain and hepatic steatosis.  He had a CBC completed by Dr. Forde Dandy on July 05, 2020.  The CBC showed a white cell count of 3.9, hemoglobin of 18.7 (normal is 18.0) and a platelet count of 120.  His differential showed neutrophil percentage of 47%.  His differential also otherwise normal.  His BUN and creatinine were normal as well as normal electrolytes and liver function test.  Previous laboratory data including a CBC obtained on May 05, 2019 showed a white cell count of 2.8, hemoglobin of 16.1 and a platelet count of 124.  I do not have any other labs to compare to.  Imaging studies including ultrasound of the abdomen obtained in April 2020 showed hepatic steatosis without any evidence of splenomegaly.  He is status post cholecystectomy in May 2020 which showed cholecystitis without any malignancy.  CT scan in December 2019 confirmed these findings without any evidence of splenomegaly.  He did have a colonoscopy and endoscopy in January 2020 for chronic abdominal pain.  These did not show any evidence of malignancy.   Clinically, he reports no major complaints at this time.  He does have complaints of sleep disturbances and sleep apnea although he has not been using her mask regularly because of discomfort.  He denies any recent thrombosis or bleeding episodes.  He denies any headaches or chest pain.  He does drink regularly and at times daily.  He has done that for many years.   He does not report any headaches, blurry vision, syncope or seizures. Does not report any fevers, chills or sweats.  Does not report any cough, wheezing or hemoptysis.  Does not report any chest pain, palpitation, orthopnea or leg edema.  Does not report any nausea, vomiting or abdominal pain.  Does not report any constipation or  diarrhea.  Does not report any skeletal complaints.    Does not report frequency, urgency or hematuria.  Does not report any skin rashes or lesions. Does not report any heat or cold intolerance.  Does not report any lymphadenopathy or petechiae.  Does not report any anxiety or depression.  Remaining review of systems is negative.    Past Medical History:  Diagnosis Date  . Abnormal transaminases   . Anxiety and depression   . Arthralgia   . Arthritis    knees  . Bilateral inguinal hernia 06/03/2018  . Cataract    removed both eyes  . Chest pain   . Diastolic dysfunction 70/35/0093   Moderate, Noted on ECHO  . Diverticulosis 01/04/2019   Mild, left colon  . Fatty liver 12/07/2018   Mild, Noted on CT Abd  . GERD (gastroesophageal reflux disease)   . Glucose intolerance (impaired glucose tolerance)   . History of colonic polyps   . Hyperlipidemia   . Hypothyroidism    No medication  . Insomnia   . Irregular heart beat    reduced caffefine intake has helped  . Knee pain, bilateral   . LVH (left ventricular hypertrophy) 12/12/2015   Mild, noted ECHO   . Malaise and fatigue   . Obesity   . Sleep apnea    no cpap-last sleep test di dnot indicate sleep apnea  . Tinnitus   . Umbilical hernia 81/82/9937  . Urinary hesitancy   :  Past Surgical History:  Procedure Laterality Date  . CATARACT EXTRACTION, BILATERAL    . CHOLECYSTECTOMY N/A 05/06/2019   Procedure: LAPAROSCOPIC CHOLECYSTECTOMY;  Surgeon: Coralie Keens, MD;  Location: [facility];  Service: General;  Laterality: N/A;  . COLON BIOPSY    . colon polyp removal    . COLONOSCOPY W/ POLYPECTOMY  01/04/2019  . POLYPECTOMY    . UPPER GI ENDOSCOPY  01/04/2019  :   Current Outpatient Medications:  .  baclofen (LIORESAL) 10 MG tablet, Take 10 mg by mouth every 8 (eight) hours., Disp: , Rfl:  .  Ciclopirox (LOPROX) 1 % shampoo, Apply 1 application topically daily as needed (rash). , Disp: , Rfl:  .  diazepam (VALIUM) 5 MG  tablet, Take 5 mg by mouth every 6 (six) hours as needed for anxiety., Disp: , Rfl:  .  dicyclomine (BENTYL) 20 MG tablet, Take 1 tablet (20 mg total) by mouth 3 (three) times daily as needed for spasms., Disp: 90 tablet, Rfl: 11 .  diphenhydramine-acetaminophen (TYLENOL PM) 25-500 MG TABS tablet, Take 1 tablet by mouth at bedtime as needed., Disp: , Rfl:  .  famotidine-calcium carbonate-magnesium hydroxide (PEPCID COMPLETE) 10-800-165 MG chewable tablet, Chew 1 tablet by mouth 2 (two) times a day., Disp: , Rfl:  .  NEOMYCIN-POLYMYXIN-HYDROCORTISONE (CORTISPORIN) 1 % SOLN OTIC solution, Apply 1-2 drops to toe BID after soaking, Disp: 10 mL, Rfl: 1 .  pantoprazole (PROTONIX) 40 MG tablet, Take 1 tablet (40 mg total) by mouth daily., Disp: 30 tablet, Rfl: 11 .  Probiotic Product (RESTORA PO), Take 1 tablet by mouth daily., Disp: , Rfl:  .  traMADol (ULTRAM) 50 MG tablet, Take 1 tablet (50 mg total) by mouth 3 (three) times daily as needed (abdominal pain)., Disp: 20 tablet, Rfl: 0:  No Known Allergies:  Family History  Problem Relation Age of Onset  . Lung disease Father        pulmonary fibrosis  . Colon cancer Paternal Aunt   . Stomach cancer Maternal Grandmother   . Colon polyps Neg Hx   . Esophageal cancer Neg Hx   . Rectal cancer Neg Hx   :  Social History   Socioeconomic History  . Marital status: Married    Spouse name: Not on file  . Number of children: Not on file  . Years of education: Not on file  . Highest education level: Not on file  Occupational History  . Not on file  Tobacco Use  . Smoking status: Former Smoker    Packs/day: 1.00    Years: 10.00    Pack years: 10.00    Types: Cigarettes    Quit date: 12/30/1988    Years since quitting: 31.5  . Smokeless tobacco: Never Used  Vaping Use  . Vaping Use: Never used  Substance and Sexual Activity  . Alcohol use: Yes    Alcohol/week: 15.0 standard drinks    Types: 15 Cans of beer per week    Comment: 15 beers per  week   . Drug use: No  . Sexual activity: Not on file  Other Topics Concern  . Not on file  Social History Narrative   Denies caffeine consumption.   Social Determinants of Health   Financial Resource Strain:   . Difficulty of Paying Living Expenses:   Food Insecurity:   . Worried About Charity fundraiser in the Last Year:   . Arboriculturist in the Last Year:   Transportation Needs:   .  Lack of Transportation (Medical):   Marland Kitchen Lack of Transportation (Non-Medical):   Physical Activity:   . Days of Exercise per Week:   . Minutes of Exercise per Session:   Stress:   . Feeling of Stress :   Social Connections:   . Frequency of Communication with Friends and Family:   . Frequency of Social Gatherings with Friends and Family:   . Attends Religious Services:   . Active Member of Clubs or Organizations:   . Attends Archivist Meetings:   Marland Kitchen Marital Status:   Intimate Partner Violence:   . Fear of Current or Ex-Partner:   . Emotionally Abused:   Marland Kitchen Physically Abused:   . Sexually Abused:   :  Pertinent items are noted in HPI.  Exam: Blood pressure 140/73, pulse 65, temperature 98.1 F (36.7 C), temperature source Temporal, resp. rate 18, height 5\' 9"  (1.753 m), weight 246 lb 11.2 oz (111.9 kg), SpO2 98 %.   ECOG 1 General appearance: alert and cooperative appeared without distress. Head: atraumatic without any abnormalities. Eyes: conjunctivae/corneas clear. PERRL.  Sclera anicteric. Throat: lips, mucosa, and tongue normal; without oral thrush or ulcers. Resp: clear to auscultation bilaterally without rhonchi, wheezes or dullness to percussion. Cardio: regular rate and rhythm, S1, S2 normal, no murmur, click, rub or gallop GI: soft, non-tender; bowel sounds normal; no masses,  no organomegaly Skin: Skin color, texture, turgor normal. No rashes or lesions Lymph nodes: Cervical, supraclavicular, and axillary nodes normal. Neurologic: Grossly normal without any motor,  sensory or deep tendon reflexes. Musculoskeletal: No joint deformity or effusion.  CBC    Component Value Date/Time   WBC 2.8 (L) 05/05/2019 1147   RBC 4.86 05/05/2019 1147   HGB 16.1 05/05/2019 1147   HCT 47.1 05/05/2019 1147   PLT 124 (L) 05/05/2019 1147   MCV 96.9 05/05/2019 1147   MCH 33.1 05/05/2019 1147   MCHC 34.2 05/05/2019 1147   RDW 13.1 05/05/2019 1147     Assessment and Plan:    69 year old with:  1.  Erythrocytosis noted with elevated hemoglobin of 18.7 with the upper limit of normal at 18 in July 2021.  His hematocrit was 56.8.  The differential diagnosis was reviewed today with the patient.  Polycythemia vera myeloproliferative disorder would be a possibility and will be evaluated with a JAK2 mutation and a myeloproliferative disorder panel.  Secondary causes such as sleep apnea could also be contributing factor at this time.  He does have a smoking history although has been removed in the past.  There is no history of carbon monoxide exposure or testosterone supplement.  From a management standpoint, I do not recommend any specific intervention at this time.  We will evaluate him for polycythemia vera and he does not require a phlebotomy at this time.  This will be reserved in the future if his hemoglobin does not improve after addressing his sleep apnea.  I recommended evaluation and treatment of his sleep apnea first before attempting any phlebotomy.  I have we will repeat his laboratory testing including polycythemia vera screening with the next visit.  2.  Thrombosis prophylaxis: Risk of thrombosis is low I agree with low-dose aspirin which he is currently taking.  3.  Mild leukocytopenia and thrombocytopenia: Likely related to a hematological condition.  This could be related to hepatic steatosis and alcohol intake.  I recommended decreasing his alcohol intake for the time being we will continue to monitor.  4.  Follow-up: Will be in 6  months for repeat  evaluation.  45  minutes were dedicated to this visit. The time was spent on reviewing laboratory data, discussing treatment options, discussing differential diagnosis and answering questions regarding future plan.      A copy of this consult has been forwarded to the requesting physician.

## 2020-07-20 ENCOUNTER — Telehealth: Payer: Self-pay | Admitting: Oncology

## 2020-07-20 NOTE — Telephone Encounter (Signed)
Scheduled per 07/21 los, patient has been called and voicemail was left. 

## 2020-11-03 ENCOUNTER — Ambulatory Visit: Payer: Medicare Other | Admitting: Cardiology

## 2020-11-03 ENCOUNTER — Other Ambulatory Visit: Payer: Self-pay

## 2020-11-03 ENCOUNTER — Ambulatory Visit: Payer: Medicare Other

## 2020-11-03 ENCOUNTER — Encounter: Payer: Self-pay | Admitting: Cardiology

## 2020-11-03 VITALS — BP 134/70 | HR 65 | Resp 16 | Ht 69.0 in | Wt 248.0 lb

## 2020-11-03 DIAGNOSIS — I499 Cardiac arrhythmia, unspecified: Secondary | ICD-10-CM

## 2020-11-03 DIAGNOSIS — I517 Cardiomegaly: Secondary | ICD-10-CM | POA: Insufficient documentation

## 2020-11-03 DIAGNOSIS — G4733 Obstructive sleep apnea (adult) (pediatric): Secondary | ICD-10-CM

## 2020-11-03 NOTE — Progress Notes (Signed)
Patient referred by Reynold Bowen, MD for irregular heart beat  Subjective:   Joseph Mccall, male    DOB: 01-10-1951, 69 y.o.   MRN: [id number]   Chief Complaint  Patient presents with  . Irregular Heart Beat  . New Patient (Initial Visit)    HPI  69 y.o. Caucasian male with hypothyroidism, hyperlipidemia, erythrocytosis, OSA, referred for evaluation of irregular heart beat.   Patient states that he has had irregular heart rhythm that happens from time to time.  He states that this is recently improved.  He does not have any associated chest pain, shortness of breath, presyncope, syncope symptoms.  He walks about a mile and a half couple days a week without any difficulty.  He was diagnosed with mild obstructive sleep apnea in 2016 but is currently not on any CPAP treatment.  He recently saw hematologist Dr. Alen Blew for evaluation of erythrocytosis.  He will undergo evaluation for polycythemia vera.  Past Medical History:  Diagnosis Date  . Abnormal transaminases   . Anxiety and depression   . Arthralgia   . Arthritis    knees  . Bilateral inguinal hernia 06/03/2018  . Cataract    removed both eyes  . Chest pain   . Diastolic dysfunction 58/52/7782   Moderate, Noted on ECHO  . Diverticulosis 01/04/2019   Mild, left colon  . Fatty liver 12/07/2018   Mild, Noted on CT Abd  . GERD (gastroesophageal reflux disease)   . Glucose intolerance (impaired glucose tolerance)   . History of colonic polyps   . Hyperlipidemia   . Hypothyroidism    No medication  . Insomnia   . Irregular heart beat    reduced caffefine intake has helped  . Knee pain, bilateral   . LVH (left ventricular hypertrophy) 12/12/2015   Mild, noted ECHO   . Malaise and fatigue   . Obesity   . Sleep apnea    no cpap-last sleep test di dnot indicate sleep apnea  . Tinnitus   . Umbilical hernia 42/35/3614  . Urinary hesitancy      Past Surgical History:  Procedure Laterality Date  . CATARACT  EXTRACTION, BILATERAL    . CHOLECYSTECTOMY N/A 05/06/2019   Procedure: LAPAROSCOPIC CHOLECYSTECTOMY;  Surgeon: Coralie Keens, MD;  Location: [facility];  Service: General;  Laterality: N/A;  . COLON BIOPSY    . colon polyp removal    . COLONOSCOPY W/ POLYPECTOMY  01/04/2019  . POLYPECTOMY    . UPPER GI ENDOSCOPY  01/04/2019     Social History   Tobacco Use  Smoking Status Former Smoker  . Packs/day: 1.00  . Years: 10.00  . Pack years: 10.00  . Types: Cigarettes  . Quit date: 12/30/1988  . Years since quitting: 31.8  Smokeless Tobacco Never Used    Social History   Substance and Sexual Activity  Alcohol Use Yes  . Alcohol/week: 15.0 standard drinks  . Types: 15 Cans of beer per week   Comment: 15 beers per week      Family History  Problem Relation Age of Onset  . Lung disease Father        pulmonary fibrosis  . Colon cancer Paternal Aunt   . Stomach cancer Maternal Grandmother   . Colon polyps Neg Hx   . Esophageal cancer Neg Hx   . Rectal cancer Neg Hx      Current Outpatient Medications on File Prior to Visit  Medication Sig Dispense Refill  . baclofen (LIORESAL)  10 MG tablet Take 10 mg by mouth every 8 (eight) hours.    . Ciclopirox (LOPROX) 1 % shampoo Apply 1 application topically daily as needed (rash).     . diazepam (VALIUM) 5 MG tablet Take 5 mg by mouth every 6 (six) hours as needed for anxiety.    . dicyclomine (BENTYL) 20 MG tablet Take 1 tablet (20 mg total) by mouth 3 (three) times daily as needed for spasms. 90 tablet 11  . diphenhydramine-acetaminophen (TYLENOL PM) 25-500 MG TABS tablet Take 1 tablet by mouth at bedtime as needed.    . famotidine-calcium carbonate-magnesium hydroxide (PEPCID COMPLETE) 10-800-165 MG chewable tablet Chew 1 tablet by mouth 2 (two) times a day.    . NEOMYCIN-POLYMYXIN-HYDROCORTISONE (CORTISPORIN) 1 % SOLN OTIC solution Apply 1-2 drops to toe BID after soaking 10 mL 1  . pantoprazole (PROTONIX) 40 MG tablet Take 1 tablet  (40 mg total) by mouth daily. 30 tablet 11  . Probiotic Product (RESTORA PO) Take 1 tablet by mouth daily.    . traMADol (ULTRAM) 50 MG tablet Take 1 tablet (50 mg total) by mouth 3 (three) times daily as needed (abdominal pain). 20 tablet 0   No current facility-administered medications on file prior to visit.    Cardiovascular and other pertinent studies:  EKG 11/03/2020: Sinus rhythm 63 bpm  Normal EKG  Echocardiogram 2016: Normal LV size and systolic function, EF 41-58%. Mild LV  hypertrophy. Moderate diastolic dysfunction. Normal RV size and  systolic function.   Recent labs: 07/05/2020: Glucose 94, BUN/Cr 5/0.8. EGFR 96. Na/K 143/4.8. Rest of the CMP normal H/H 18/56. MCV 96. Platelets 120 HbA1C 5.9% Chol 189, TG 223, HDL 38, LDL 106 TSH 6.7 high    Review of Systems  Cardiovascular: Positive for palpitations. Negative for chest pain, dyspnea on exertion, leg swelling and syncope.  Respiratory: Positive for snoring.          Vitals:   11/03/20 1340  BP: 134/70  Pulse: 65  Resp: 16  SpO2: 95%     Body mass index is 36.62 kg/m. Filed Weights   11/03/20 1340  Weight: 248 lb (112.5 kg)     Objective:   Physical Exam Vitals and nursing note reviewed.  Constitutional:      General: He is not in acute distress. Neck:     Vascular: No JVD.  Cardiovascular:     Rate and Rhythm: Normal rate and regular rhythm.     Heart sounds: Normal heart sounds. No murmur heard.   Pulmonary:     Effort: Pulmonary effort is normal.     Breath sounds: Normal breath sounds. No wheezing or rales.         Assessment & Recommendations:   69 y.o. Caucasian male with hypothyroidism, hyperlipidemia, erythrocytosis, OSA, referred for evaluation of irregular heart beat.   Given his untreated OSA, and hypertension, atrial fibrillation is a possibility.  Recommend echocardiogram and 2-week monitor.  Previous echocardiogram showed mild LVH. Also referred for sleep study  evaluation.  He will benefit from CPAP treatment.  Further recommendations after above testing   Thank you for referring the patient to [place]. Please feel free to contact with any questions.   Nigel Mormon, MD Pager: [phone] Office: [phone]

## 2020-12-06 ENCOUNTER — Encounter: Payer: Self-pay | Admitting: Neurology

## 2020-12-06 ENCOUNTER — Ambulatory Visit: Payer: Medicare Other | Admitting: Neurology

## 2020-12-06 VITALS — BP 127/76 | HR 68 | Ht 69.0 in | Wt 245.0 lb

## 2020-12-06 DIAGNOSIS — G4733 Obstructive sleep apnea (adult) (pediatric): Secondary | ICD-10-CM

## 2020-12-06 DIAGNOSIS — D751 Secondary polycythemia: Secondary | ICD-10-CM | POA: Diagnosis not present

## 2020-12-06 DIAGNOSIS — F411 Generalized anxiety disorder: Secondary | ICD-10-CM | POA: Diagnosis not present

## 2020-12-06 DIAGNOSIS — G4723 Circadian rhythm sleep disorder, irregular sleep wake type: Secondary | ICD-10-CM | POA: Diagnosis not present

## 2020-12-06 NOTE — Progress Notes (Signed)
SLEEP MEDICINE CLINIC    Provider:  Larey Seat, MD  Primary Care Physician:  Reynold Bowen, MD [place] [address]     Referring Provider: Reynold Bowen, [place],  [place] [address]          Chief Complaint according to patient   Patient presents with:    . New Patient (Initial Visit)           HISTORY OF PRESENT ILLNESS:  Joseph Mccall is a 69 - year- old Caucasian male patient seen here upon a referral from Dr. Virgina Jock after a  request of consultation on 12/06/2020 . Chief concern according to patient :  see below- he was seen in 2016.   I have the pleasure of seeing Joseph Mccall today, a right-handed Caucasian male with a possible sleep disorder.he had been a former night shift worker and has for a long time struggled with sleep pattern and habits not conducive to refreshing, restorative sleep. He wakes up with gasping, and he commented on his mouth being wide open.He isconvinced he has sleep apnea.  Joseph Mccall has therefore no history of problems with anesthesia following 3 at least 3 procedures with general anesthesia or conscious sedation and his surgeons had not commented on apnea.  He had a family member commenting on his snoring and having witnessed apneas when I saw him first over 5 years ago in referral.  Previous sleep study performed on 2006 had actually shown an AHI of 16.5.  He had left ventricular hypertrophy by echocardiogram at that time.  He is now returning because of a laboratory finding erythrocytosis or poorly every 3 male for this evaluation he was also sent to hematologist Dr. Manuella Ghazi..  Dr. Alen Blew is convinced that this is not a primary bone marrow abnormality but related to untreated apnea.  The patient had been reevaluated in September 2016 his study was performed as a home sleep test and showed an AHI of only 6.3 oxygen nadir was 77% with only 12 minutes of desaturation.  So our goal is now to reevaluate Joseph Mccall but I would prefer not to do this by a home sleep test but by an attended sleep study given his history. He takes melatonin. Has diazepam prn.   He a past medical history of Abnormal transaminases, Anxiety and depression, Arthralgia, Arthritis, Bilateral inguinal hernia (06/03/2018), Cataract, Chest pain, Diastolic dysfunction (21/19/4174), Diverticulosis (01/04/2019), Fatty liver (12/07/2018), GERD (gastroesophageal reflux disease), Glucose intolerance (impaired glucose tolerance), History of colonic polyps, Hyperlipidemia, Hypothyroidism, Insomnia, Irregular heart beat, Knee pain, bilateral, LVH (left ventricular hypertrophy) (12/12/2015), Malaise and fatigue, Obesity, Sleep apnea, Tinnitus, Umbilical hernia (08/12/4817), and Urinary hesitancy.   The patient had the his  Last HST  study in the year 2016 followed an in la study attempt, and the patient had not been able to sleep in the lab at all . AHI was very,very mild.   Sleep relevant medical history:  Completely free running sleep cycle, circadian rhythm disorder  Based in shift work. Nocturia 3 times or more. Insomnia. Newly diagnosed with erythrocythemia.   Family medical /sleep history : No other family member on CPAP with OSA, insomnia, sleep walkers. mother is 65 is a good sleeper.    Social history: Patient is retired from shift work, but now works form home 8.30- 12.30, 2 days a week.   and he lives in a household with is spouse, a "good sleeper" . Family status  is married without children.  The patient currently works part time. Pets are not present. Tobacco use;  Quit 30 years ago. ETOH use;  2-4 drinks a day, 3 days a week end. Caffeine intake ; none since he had arrhythmia . Regular exercise in form of walking.     Sleep habits are as follows: The patient's dinner time is between 7 PM. He aims for 11 Pm as a bedtime.  The patient goes to bed at 11.30 but he often can't sleep-for 1-2 hours. Sometimes all night without  sleep. Once asleep, he continues to sleep for intervals of 2-4  hours, wakes for bathroom breaks. Totally asleep for 4-6 hours. Fragmented sleep.  The preferred sleep position is any position, but mostly finds himself supine or on his right , with the support of 5-6  pillows. Dreams are reportedly frequent/vivid.  8 AM is the usual rise time. The patient wakes up with an alarm.  He reports not feeling refreshed or restored in AM, with symptoms such as very dry mouth , rare morning headaches , and residual fatigue. He feels dehydrated.  Naps are taken very frequently, lasting from several hours, further increasing the insomnia at night and l ess refreshing than nocturnal sleep.    Review of Systems: Out of a complete 14 system review, the patient complains of only the following symptoms, and all other reviewed systems are negative.:  Fatigue, sleepiness , snoring,witnessed apnea. Claustrophobia- anxiety - .  fragmented sleep, Insomnia - poor circadian rhythm , poor sleep hygiene.  Nocturia.    How likely are you to doze in the following situations: 0 = not likely, 1 = slight chance, 2 = moderate chance, 3 = high chance   Sitting and Reading? Watching Television? Sitting inactive in a public place (theater or meeting)? As a passenger in a car for an hour without a break? Lying down in the afternoon when circumstances permit? Sitting and talking to someone? Sitting quietly after lunch without alcohol? In a car, while stopped for a few minutes in traffic?   Total = 13/ 24 points   FSS endorsed at 49 / 63 points.   Social History   Socioeconomic History  . Marital status: Married    Spouse name: Not on file  . Number of children: 0  . Years of education: Not on file  . Highest education level: Not on file  Occupational History  . Not on file  Tobacco Use  . Smoking status: Former Smoker    Packs/day: 1.00    Years: 10.00    Pack years: 10.00    Types: Cigarettes    Quit date:  12/30/1988    Years since quitting: 31.9  . Smokeless tobacco: Never Used  Vaping Use  . Vaping Use: Never used  Substance and Sexual Activity  . Alcohol use: Yes    Alcohol/week: 15.0 standard drinks    Types: 15 Cans of beer per week    Comment: 15 beers per week   . Drug use: No  . Sexual activity: Not on file  Other Topics Concern  . Not on file  Social History Narrative   Denies caffeine consumption.   Social Determinants of Health   Financial Resource Strain:   . Difficulty of Paying Living Expenses: Not on file  Food Insecurity:   . Worried About Charity fundraiser in the Last Year: Not on file  . Ran Out of Food in the Last Year: Not on file  Transportation  Needs:   . Lack of Transportation (Medical): Not on file  . Lack of Transportation (Non-Medical): Not on file  Physical Activity:   . Days of Exercise per Week: Not on file  . Minutes of Exercise per Session: Not on file  Stress:   . Feeling of Stress : Not on file  Social Connections:   . Frequency of Communication with Friends and Family: Not on file  . Frequency of Social Gatherings with Friends and Family: Not on file  . Attends Religious Services: Not on file  . Active Member of Clubs or Organizations: Not on file  . Attends Archivist Meetings: Not on file  . Marital Status: Not on file    Family History  Problem Relation Age of Onset  . Lung disease Father        pulmonary fibrosis  . Colon cancer Paternal Aunt   . Stomach cancer Maternal Grandmother   . Colon polyps Neg Hx   . Esophageal cancer Neg Hx   . Rectal cancer Neg Hx     Past Medical History:  Diagnosis Date  . Abnormal transaminases   . Anxiety and depression   . Arthralgia   . Arthritis    knees  . Bilateral inguinal hernia 06/03/2018  . Cataract    removed both eyes  . Chest pain   . Diastolic dysfunction 16/09/9603   Moderate, Noted on ECHO  . Diverticulosis 01/04/2019   Mild, left colon  . Fatty liver  12/07/2018   Mild, Noted on CT Abd  . GERD (gastroesophageal reflux disease)   . Glucose intolerance (impaired glucose tolerance)   . History of colonic polyps   . Hyperlipidemia   . Hypothyroidism    No medication  . Insomnia   . Irregular heart beat    reduced caffefine intake has helped  . Knee pain, bilateral   . LVH (left ventricular hypertrophy) 12/12/2015   Mild, noted ECHO   . Malaise and fatigue   . Obesity   . Sleep apnea    no cpap-last sleep test di dnot indicate sleep apnea  . Tinnitus   . Umbilical hernia 54/08/8118  . Urinary hesitancy     Past Surgical History:  Procedure Laterality Date  . CATARACT EXTRACTION, BILATERAL    . CHOLECYSTECTOMY N/A 05/06/2019   Procedure: LAPAROSCOPIC CHOLECYSTECTOMY;  Surgeon: Coralie Keens, MD;  Location: [facility];  Service: General;  Laterality: N/A;  . COLON BIOPSY    . colon polyp removal    . COLONOSCOPY W/ POLYPECTOMY  01/04/2019  . POLYPECTOMY    . UPPER GI ENDOSCOPY  01/04/2019     Current Outpatient Medications on File Prior to Visit  Medication Sig Dispense Refill  . baclofen (LIORESAL) 10 MG tablet Take 10 mg by mouth every 8 (eight) hours.    . Ciclopirox (LOPROX) 1 % shampoo Apply 1 application topically daily as needed (rash).     . diazepam (VALIUM) 5 MG tablet Take 5 mg by mouth every 6 (six) hours as needed for anxiety.    . dicyclomine (BENTYL) 20 MG tablet Take 1 tablet (20 mg total) by mouth 3 (three) times daily as needed for spasms. 90 tablet 11  . NEOMYCIN-POLYMYXIN-HYDROCORTISONE (CORTISPORIN) 1 % SOLN OTIC solution Apply 1-2 drops to toe BID after soaking 10 mL 1  . pantoprazole (PROTONIX) 40 MG tablet Take 1 tablet (40 mg total) by mouth daily. 30 tablet 11  . Probiotic Product (RESTORA PO) Take 1 tablet by mouth  daily.     No current facility-administered medications on file prior to visit.    No Known Allergies  Physical exam:  Today's Vitals   12/06/20 1239  BP: 127/76  Pulse: 68   Weight: 245 lb (111.1 kg)  Height: 5\' 9"  (1.753 m)   Body mass index is 36.18 kg/m.   Wt Readings from Last 3 Encounters:  12/06/20 245 lb (111.1 kg)  11/03/20 248 lb (112.5 kg)  07/19/20 246 lb 11.2 oz (111.9 kg)     Ht Readings from Last 3 Encounters:  12/06/20 5\' 9"  (1.753 m)  11/03/20 5\' 9"  (1.753 m)  07/19/20 5\' 9"  (1.753 m)      General: The patient is awake, alert and appears not in acute distress. The patient is well groomed. Head: Normocephalic, atraumatic. Neck is supple. Mallampati 3,  neck circumference:18  inches .  Nasal airflow is patent.  Retrognathia is not seen. The pt.has facial hair  Dental status: biological Cardiovascular:  Regular rate and cardiac rhythm by pulse,  without distended neck veins. Respiratory: Lungs are clear to auscultation.  Skin:  Without evidence of ankle edema, or rash. Trunk: The patient's posture is erect.   Neurologic exam : The patient is awake and alert, oriented to place and time.   Memory subjective described as intact.  Attention span & concentration ability appears normal.  Speech is fluent,  without  dysarthria, dysphonia or aphasia.  Mood and affect are appropriate.   Cranial nerves: no loss of smell or taste reported -  Had 2 shots of vaccine. Pupils are equal and briskly reactive to light. Funduscopic exam deferred, ears glasses. .  Extraocular movements in vertical and horizontal planes were intact and without nystagmus. No Diplopia. Visual fields by finger perimetry are intact. Hearing was intact to soft voice and finger rubbing.   Facial sensation intact to fine touch. Facial motor strength is symmetric and tongue and uvula move midline.  Neck ROM : rotation, tilt and flexion extension were normal for age and shoulder shrug was symmetrical.    Motor exam:  Symmetric bulk, tone and ROM.   Normal tone without cog wheeling, symmetric grip strength .   Sensory:  Fine touch, pinprick and vibration were normal.   Proprioception tested in the upper extremities was normal.   Coordination: Rapid alternating movements in the fingers/hands were of normal speed.  The Finger-to-nose maneuver was intact without evidence of ataxia, dysmetria or tremor.   Gait and station: Patient could rise unassisted from a seated position, walked without assistive device.  Stance is of normal width/ base and the patient turned with 4 steps.  Toe and heel walk were deferred.  Deep tendon reflexes: in the  upper and lower extremities are symmetric and intact.  Babinski response was deferred .       After spending a total time of  50 minutes face to face and additional time for physical and neurologic examination, review of laboratory studies,  personal review of imaging studies, reports and results of other testing and review of referral information / records as far as provided in visit, I have established the following assessments:   1) I have discussed with Mr. Mccall how to best proceed and retesting him for sleep apnea in his past he had an attended sleep study but this at one time was mild apnea and AHI under 20 as a result. He was unable to sleep another time when an attended sleep study was planned and instead underwent  a home sleep test which resulted in an AHI so low but it was not quite allowing for CPAP intervention. At this time there is a new finding of elevated red blood cell counts, I think we are having to address this problem again in an attended sleep study and I would want Joseph Mccall to bring his diazepam and even his melatonin to the sleep lab in order to have at least 4 hours of sleep to be recorded. He is also now getting used to wearing face masks during the pandemic and he may actually not be as sensitive to trying a CPAP interface now. Facial hair kept him from getting a seal when he had tried CPAP.   2) he has longstanding insomnia-  This is a circadian rhythm disorder but also a sleep hygiene issue. I  need for him to avoid day time naps, and  establishes a bed time and rise time every day of the week.  .   3) he can bring his valium to the sleep lab, we will not use a FFM - given his aversion.     My Plan is to proceed with:  1)  Attended sleep study - use study for a non FFM fitting, please.  2)  The patient will shave for the sleep study.    3)  Patient will bring his diazepam and melatonin to the sleep lab.   I would like to thank Reynold Bowen, MD and Reynold Bowen, [place],  [place] [address] for allowing me to meet with and to take care of this pleasant patient.   In short, Joseph Mccall is presenting with erythrocythemia.   I plan to follow up either personally or through our NP within 2-3 month.   CC: I will share my notes with CP and cardiologist .  Electronically signed by: Larey Seat, MD 12/06/2020 1:06 PM  [facility] and [facility] Sleep Board certified by The [employer] of Sleep Medicine and Diplomate of the [employer] of Sleep Medicine. Board certified In Neurology through the [place], Fellow of the [employer] of Neurology. Medical Director of [employer].

## 2020-12-07 NOTE — Progress Notes (Signed)
Mobile cardiac telemetry 13 days 11/03/2020 - 11/17/2020:  Dominant rhythm: Sinus.  HR 48-167 bpm. Avg HR 74 bpm.  15 episodes of SVT-likely atrial tachycardia, fastest at 143 bpm for 6 beats, longest for 13 secs at 116 bpm.  <1% isolated SVE, couplet/triplets.  1 episode of NSVT, at 167 bpm for 16 beats  <1% isolated VE, couplet/triplets.  No atrial fibrillation/atrial flutter/high grade AV block, sinus pause >3sec noted.  0 patient triggered events.

## 2020-12-14 ENCOUNTER — Telehealth: Payer: Self-pay

## 2020-12-14 NOTE — Telephone Encounter (Signed)
LVM for pt to call me back to schedule sleep study  

## 2020-12-18 ENCOUNTER — Telehealth: Payer: Self-pay

## 2020-12-18 ENCOUNTER — Other Ambulatory Visit: Payer: Self-pay | Admitting: Cardiology

## 2020-12-18 DIAGNOSIS — I4729 Other ventricular tachycardia: Secondary | ICD-10-CM

## 2020-12-18 DIAGNOSIS — I471 Supraventricular tachycardia: Secondary | ICD-10-CM

## 2020-12-18 MED ORDER — METOPROLOL TARTRATE 25 MG PO TABS: 25.0000 mg | ORAL_TABLET | Freq: Two times a day (BID) | ORAL | 3 refills | Status: DC

## 2020-12-18 NOTE — Telephone Encounter (Signed)
LVM for pt to call me back to schedule sleep study  

## 2020-12-18 NOTE — Telephone Encounter (Signed)
Mobile cardiac telemetry 13 days 11/03/2020 - 11/17/2020:  Dominant rhythm: Sinus.  HR 48-167 bpm. Avg HR 74 bpm.  15 episodes of SVT-likely atrial tachycardia, fastest at 143 bpm for 6 beats, longest for 13 secs at 116 bpm.  <1% isolated SVE, couplet/triplets.  1 episode of NSVT, at 167 bpm for 16 beats  <1% isolated VE, couplet/triplets.  No atrial fibrillation/atrial flutter/high grade AV block, sinus pause >3sec noted.  0 patient triggered events.   Reviewed the above results with the patient. Recommend metoprolol tartarate 25 mg bid, refer to sleep study.Will check echocardiogram. F/u in 3 months.  ? Nigel Mormon, MD Pager: [phone] Office: [phone]

## 2020-12-18 NOTE — Telephone Encounter (Signed)
Staff, please check echo in January, f/u in 3 months

## 2020-12-18 NOTE — Telephone Encounter (Signed)
Patient called wanting the results from his heart monitor. Pt states he had it placed back in November and never received a call. AD/S

## 2020-12-19 NOTE — Telephone Encounter (Signed)
I left him a voicemail to get scheduled for echo and f/u

## 2020-12-26 ENCOUNTER — Telehealth: Payer: Self-pay

## 2020-12-26 NOTE — Telephone Encounter (Signed)
We have attempted to call the patient two times to schedule sleep study.  Patient has been unavailable at the phone numbers we have on file and has not returned our calls. If patient calls back we will schedule them for their sleep study.  

## 2021-01-10 ENCOUNTER — Inpatient Hospital Stay: Payer: Medicare Other | Attending: Oncology

## 2021-01-10 ENCOUNTER — Other Ambulatory Visit: Payer: Self-pay

## 2021-01-10 DIAGNOSIS — G4730 Sleep apnea, unspecified: Secondary | ICD-10-CM | POA: Insufficient documentation

## 2021-01-10 DIAGNOSIS — Z87891 Personal history of nicotine dependence: Secondary | ICD-10-CM | POA: Diagnosis not present

## 2021-01-10 DIAGNOSIS — D696 Thrombocytopenia, unspecified: Secondary | ICD-10-CM | POA: Insufficient documentation

## 2021-01-10 DIAGNOSIS — Z79899 Other long term (current) drug therapy: Secondary | ICD-10-CM | POA: Diagnosis not present

## 2021-01-10 DIAGNOSIS — D45 Polycythemia vera: Secondary | ICD-10-CM

## 2021-01-10 DIAGNOSIS — D72819 Decreased white blood cell count, unspecified: Secondary | ICD-10-CM | POA: Insufficient documentation

## 2021-01-10 DIAGNOSIS — D751 Secondary polycythemia: Secondary | ICD-10-CM | POA: Insufficient documentation

## 2021-01-10 LAB — CBC WITH DIFFERENTIAL (CANCER CENTER ONLY)
Abs Immature Granulocytes: 0.01 10*3/uL (ref 0.00–0.07)
Basophils Absolute: 0 10*3/uL (ref 0.0–0.1)
Basophils Relative: 1 %
Eosinophils Absolute: 0.1 10*3/uL (ref 0.0–0.5)
Eosinophils Relative: 3 %
HCT: 50.9 % (ref 39.0–52.0)
Hemoglobin: 17.2 g/dL — ABNORMAL HIGH (ref 13.0–17.0)
Immature Granulocytes: 0 %
Lymphocytes Relative: 33 %
Lymphs Abs: 1.4 10*3/uL (ref 0.7–4.0)
MCH: 31.7 pg (ref 26.0–34.0)
MCHC: 33.8 g/dL (ref 30.0–36.0)
MCV: 93.7 fL (ref 80.0–100.0)
Monocytes Absolute: 0.4 10*3/uL (ref 0.1–1.0)
Monocytes Relative: 8 %
Neutro Abs: 2.3 10*3/uL (ref 1.7–7.7)
Neutrophils Relative %: 55 %
Platelet Count: 139 10*3/uL — ABNORMAL LOW (ref 150–400)
RBC: 5.43 MIL/uL (ref 4.22–5.81)
RDW: 13.3 % (ref 11.5–15.5)
WBC Count: 4.2 10*3/uL (ref 4.0–10.5)
nRBC: 0 % (ref 0.0–0.2)

## 2021-01-10 LAB — IRON AND TIBC
Iron: 140 ug/dL (ref 42–163)
Saturation Ratios: 33 % (ref 20–55)
TIBC: 425 ug/dL — ABNORMAL HIGH (ref 202–409)
UIBC: 286 ug/dL (ref 117–376)

## 2021-01-10 LAB — FERRITIN: Ferritin: 523 ng/mL — ABNORMAL HIGH (ref 24–336)

## 2021-01-11 LAB — ERYTHROPOIETIN: Erythropoietin: 7.4 m[IU]/mL (ref 2.6–18.5)

## 2021-01-17 ENCOUNTER — Telehealth: Payer: Self-pay

## 2021-01-17 LAB — JAK2 (INCLUDING V617F AND EXON 12), MPL,& CALR-NEXT GEN SEQ

## 2021-01-17 NOTE — Telephone Encounter (Signed)
Called patient to make him aware of lab results and Dr. Hazeline Junker message below. No answer. Left voicemail for patient to call Dr. Hazeline Junker nurse at [phone] for lab results.

## 2021-01-17 NOTE — Telephone Encounter (Signed)
-----   Message from Wyatt Portela, MD sent at 01/17/2021  1:57 PM EST ----- Please let him know all his tests are normal. His hgb is mildly elevated but no intervention is needed.

## 2021-01-22 ENCOUNTER — Telehealth: Payer: Self-pay

## 2021-01-22 NOTE — Telephone Encounter (Signed)
Patient called and left a message asking for a return call with his lab results. I advised him that Dr. Alen Blew stated that all of his tests were normal and that his Hgb is mildly elevated but that no intervention was needed. Patient verbalized understanding.

## 2021-01-24 ENCOUNTER — Other Ambulatory Visit: Payer: Self-pay

## 2021-01-24 ENCOUNTER — Inpatient Hospital Stay: Payer: Medicare Other | Admitting: Oncology

## 2021-01-24 VITALS — BP 150/61 | HR 65 | Temp 97.8°F | Resp 20 | Wt 249.9 lb

## 2021-01-24 DIAGNOSIS — D45 Polycythemia vera: Secondary | ICD-10-CM

## 2021-01-24 DIAGNOSIS — D751 Secondary polycythemia: Secondary | ICD-10-CM | POA: Diagnosis not present

## 2021-01-24 NOTE — Progress Notes (Signed)
Hematology and Oncology Follow Up Visit  Joseph Mccall [id number] April 26, 1951 70 y.o. 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  This was noted in July 2021.    Current therapy: Active surveillance without any indication for treatment.  Interim History: Joseph Mccall returns today for a follow-up visit.  Since the last visit, he reports no major changes in his health.  He denies any recent thrombosis or bleeding.  He denies any recent hospitalization or illnesses.  He denies any excessive fatigue or tiredness.  He continues to drink rather heavily 2-3 drinks almost every day.     Medications: I have reviewed the patient's current medications.  Current Outpatient Medications  Medication Sig Dispense Refill  . baclofen (LIORESAL) 10 MG tablet Take 10 mg by mouth every 8 (eight) hours.    . Ciclopirox (LOPROX) 1 % shampoo Apply 1 application topically daily as needed (rash).     . diazepam (VALIUM) 5 MG tablet Take 5 mg by mouth every 6 (six) hours as needed for anxiety.    . dicyclomine (BENTYL) 20 MG tablet Take 1 tablet (20 mg total) by mouth 3 (three) times daily as needed for spasms. 90 tablet 11  . metoprolol tartrate (LOPRESSOR) 25 MG tablet Take 1 tablet (25 mg total) by mouth 2 (two) times daily. 60 tablet 3  . NEOMYCIN-POLYMYXIN-HYDROCORTISONE (CORTISPORIN) 1 % SOLN OTIC solution Apply 1-2 drops to toe BID after soaking 10 mL 1  . pantoprazole (PROTONIX) 40 MG tablet Take 1 tablet (40 mg total) by mouth daily. 30 tablet 11  . Probiotic Product (RESTORA PO) Take 1 tablet by mouth daily.     No current facility-administered medications for this visit.     Allergies: No Known Allergies    Physical Exam: Blood pressure (!) 150/61, pulse 65, temperature 97.8 F (36.6 C), temperature source Temporal, resp. rate 20, weight 249  lb 14.4 oz (113.4 kg), SpO2 97 %.   ECOG: 1  General appearance: Comfortable appearing without any discomfort Head: Normocephalic without any trauma Oropharynx: Mucous membranes are moist and pink without any thrush or ulcers. Eyes: Pupils are equal and round reactive to light. Lymph nodes: No cervical, supraclavicular, inguinal or axillary lymphadenopathy.   Heart:regular rate and rhythm.  S1 and S2 without leg edema. Lung: Clear without any rhonchi or wheezes.  No dullness to percussion. Abdomin: Soft, nontender, nondistended with good bowel sounds.  No hepatosplenomegaly. Musculoskeletal: No joint deformity or effusion.  Full range of motion noted. Neurological: No deficits noted on motor, sensory and deep tendon reflex exam. Skin: No petechial rash or dryness.  Appeared moist.      Lab Results: Lab Results  Component Value Date   WBC 4.2 01/10/2021   HGB 17.2 (H) 01/10/2021   HCT 50.9 01/10/2021   MCV 93.7 01/10/2021   PLT 139 (L) 01/10/2021     Chemistry   No results found for: NA, K, CL, CO2, BUN, CREATININE, GLU No results found for: CALCIUM, ALKPHOS, AST, ALT, BILITOT      Impression and Plan:  70 year old with:  1.    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  He was found to have a hemoglobin of 18.7.  His work-up did not reveal any myeloproliferative disorder.   Laboratory data from January 10, 2021 were personally reviewed at this time and showed hemoglobin of 17.2 which is mildly elevated but certainly  improved from previous counts.  He is JAK2 mutation and myeloproliferative disorder panel is negative.  Given these findings, I recommended continued active surveillance at this time and no need for intervention.  Recommended decreasing alcohol and tobacco use which could help with his counts.  I recommended blood donation periodically every 6 to 12 months if he is willing to do so.   2.  Thrombosis prophylaxis: Risk of thrombosis is low at this time he  is currently on aspirin.  3.  Mild leukocytopenia and thrombocytopenia: His CBC shows normal white cell count and close to normal platelets.  4.  Follow-up: I am happy to see him in the future as needed.  30  minutes were spent on this encounter.  The time was dedicated to reviewing disease status, discussing treatment options and future plan of care reviewed.    Joseph Button, MD 1/26/202210:22 AM

## 2021-03-06 IMAGING — US ULTRASOUND ABDOMEN LIMITED
1 series · 14 of 25 positions shown · non-contrast
Comparison: 12/07/2018 CT

CLINICAL DATA: Right abdominal pain

EXAM:
ULTRASOUND ABDOMEN LIMITED RIGHT UPPER QUADRANT

[Series 1: ultrasound abdomen limited · 0.23mm/px · 14 of 42 slices shown]
[im 1/42]
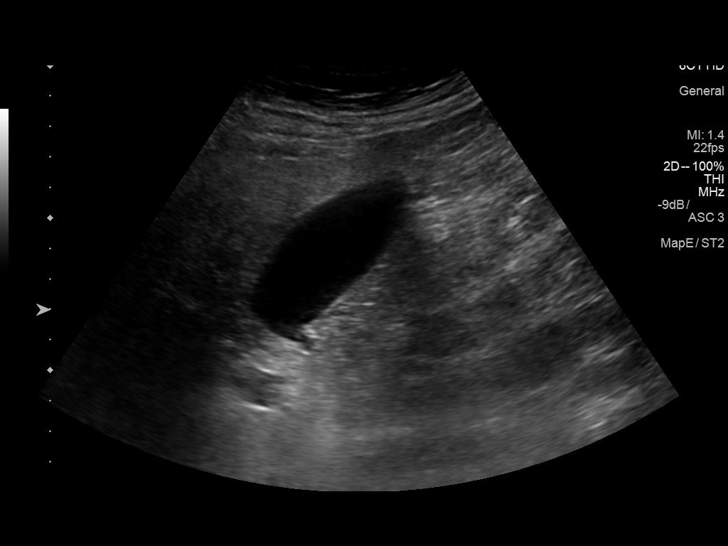
[im 4/42]
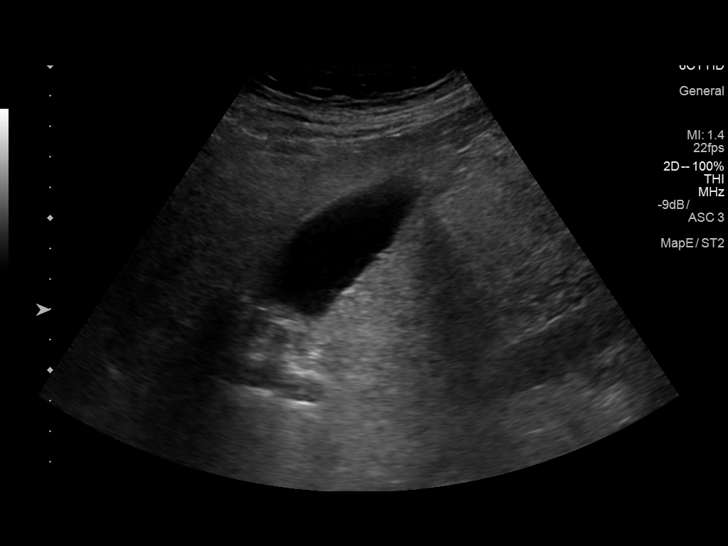
[im 7/42]
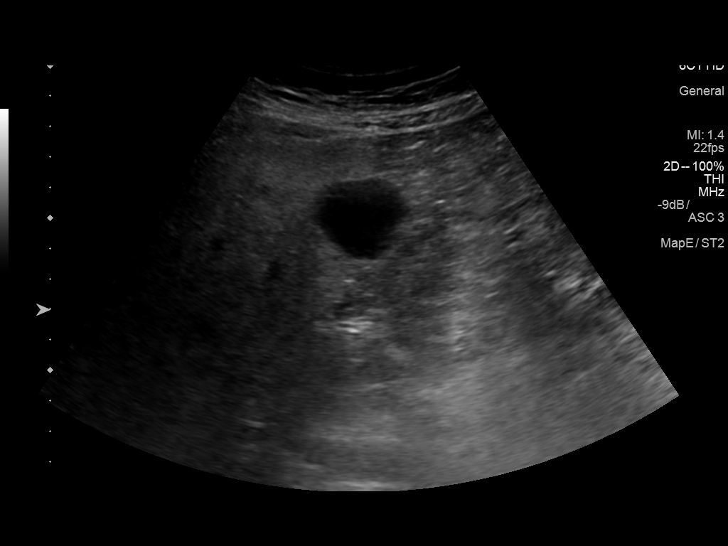
[im 11/42]
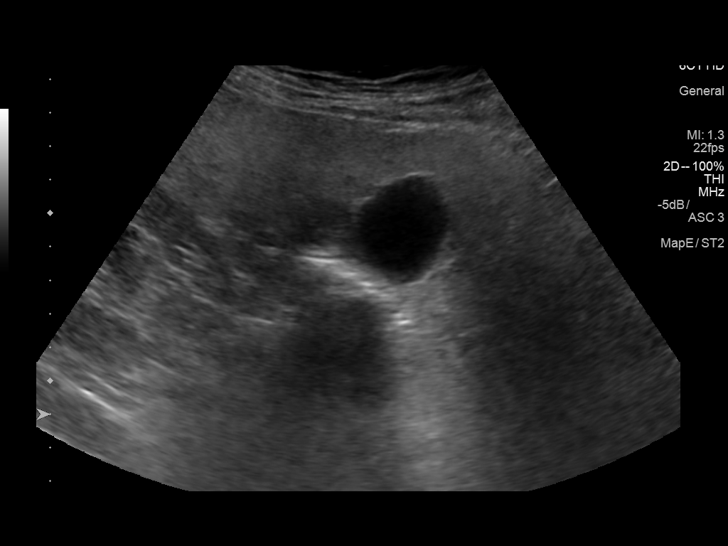
[im 14/42]
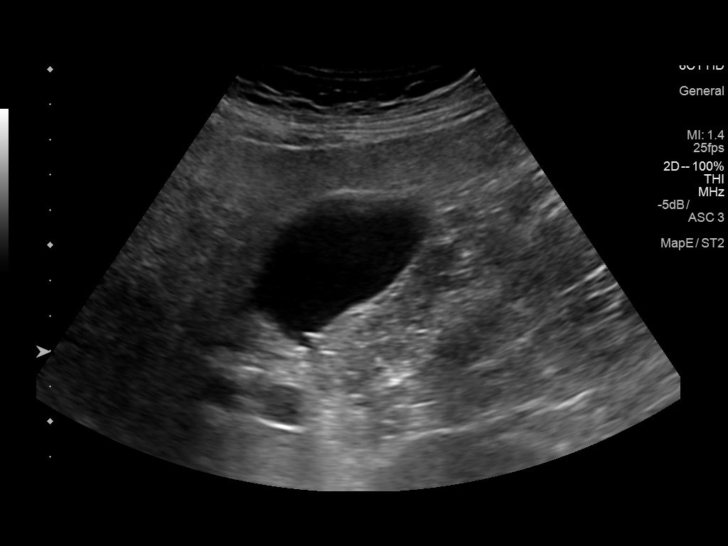
[im 16/42]
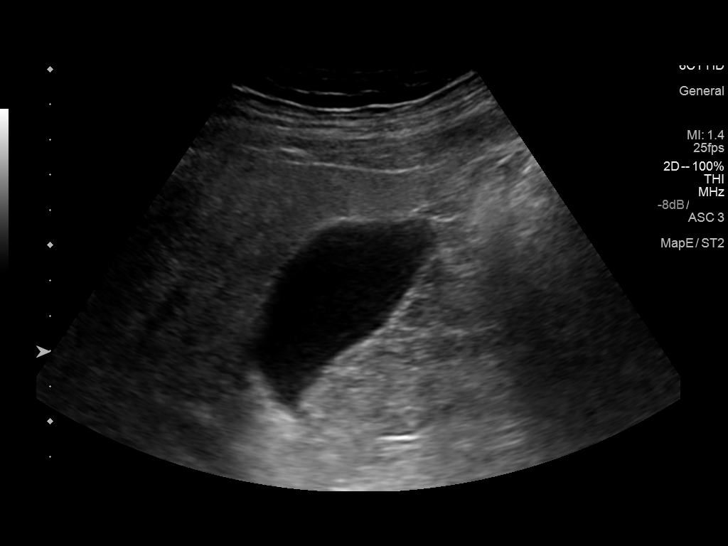
[im 19/42]
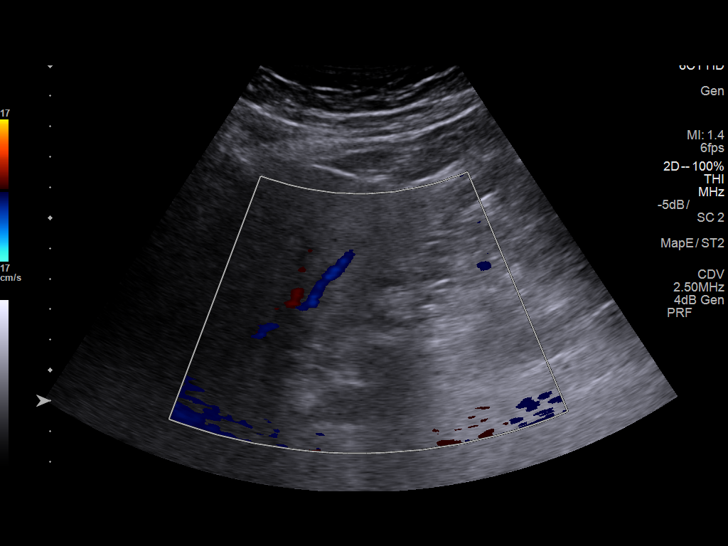
[im 23/42]
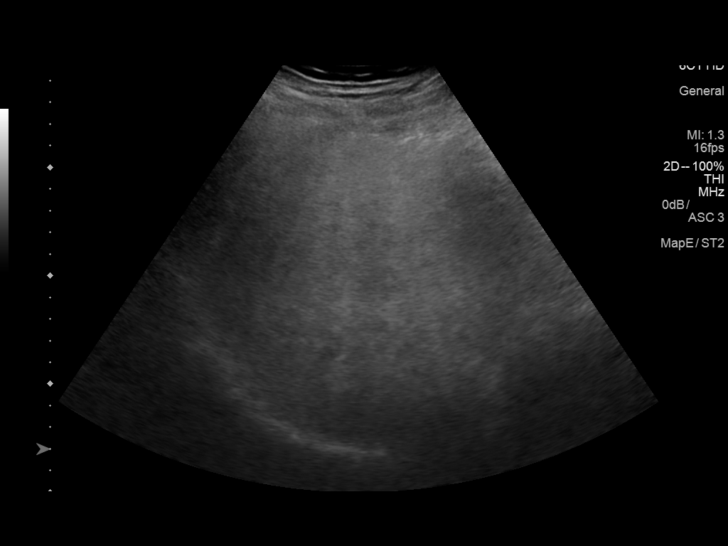
[im 26/42]
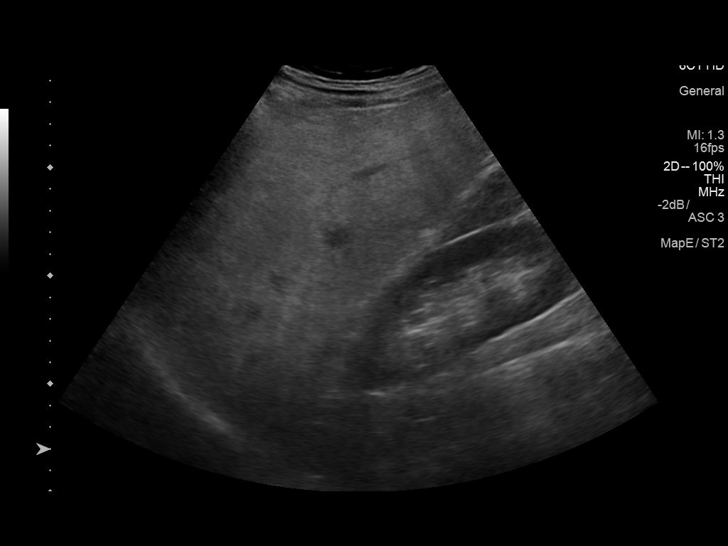
[im 28/42]
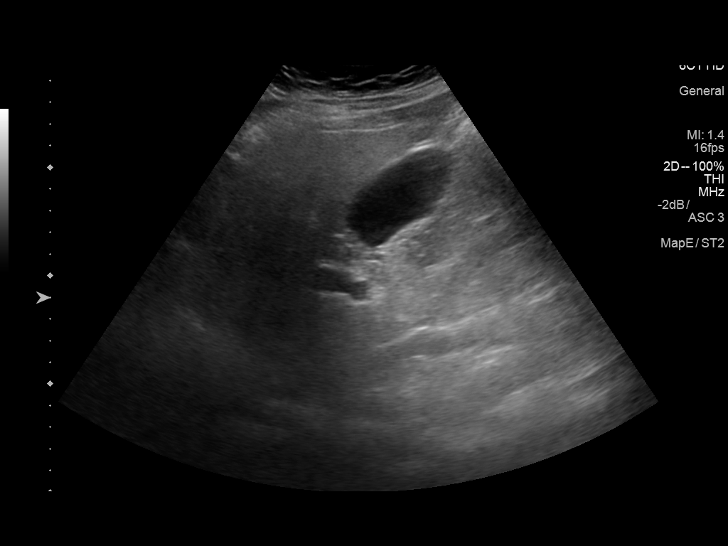
[im 31/42]
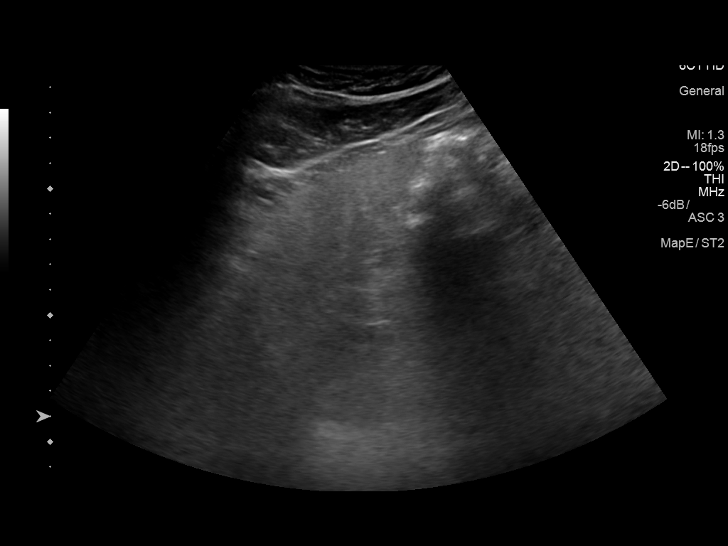
[im 35/42]
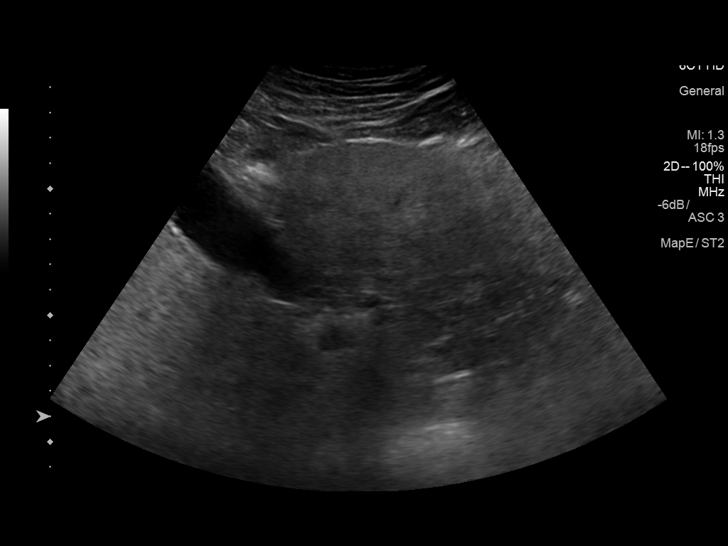
[im 38/42]
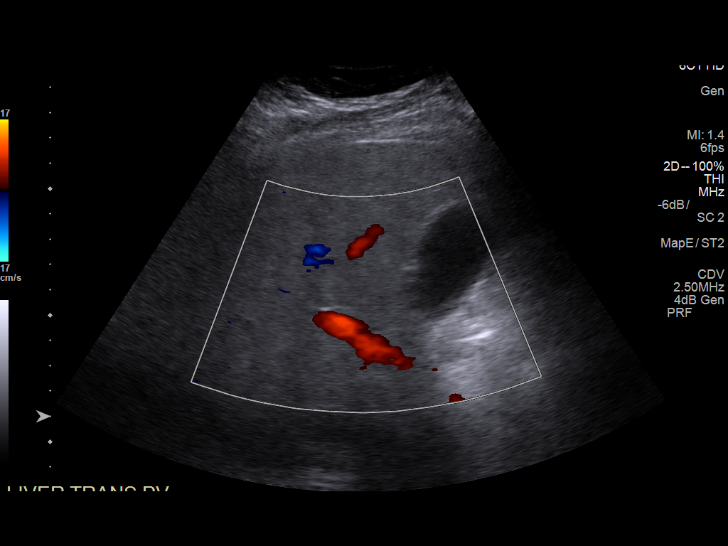
[im 42/42]
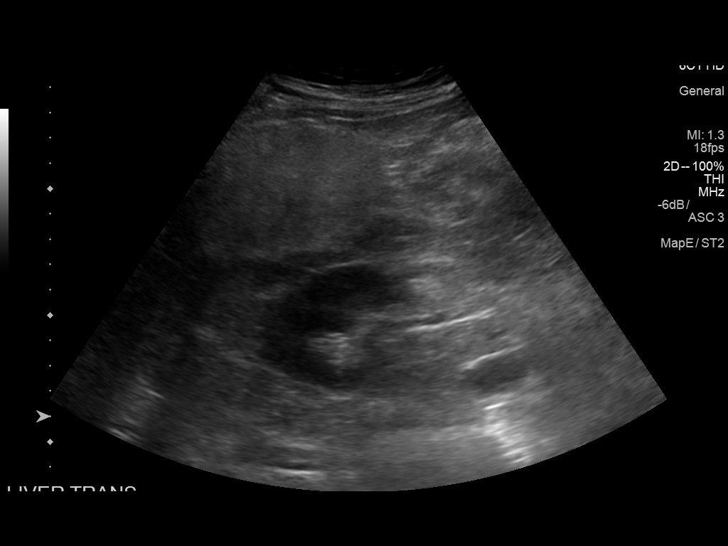

[14 of 25 positions shown; findings below may reference images not displayed]

FINDINGS: Gallbladder:

No gallstones or wall thickening visualized. No sonographic Murphy
sign noted by sonographer.

Common bile duct:

Diameter: Normal caliber, 2 mm

Liver:

Increased echotexture compatible with fatty infiltration. No focal
abnormality or biliary ductal dilatation. Portal vein is patent on
color Doppler imaging with normal direction of blood flow towards
the liver.
IMPRESSION: Fatty infiltration of the liver.

No acute findings.

## 2021-03-27 ENCOUNTER — Ambulatory Visit: Payer: Medicare Other

## 2021-03-27 ENCOUNTER — Other Ambulatory Visit: Payer: Self-pay

## 2021-03-27 DIAGNOSIS — I4729 Other ventricular tachycardia: Secondary | ICD-10-CM

## 2021-03-27 DIAGNOSIS — I472 Ventricular tachycardia: Secondary | ICD-10-CM

## 2021-03-27 DIAGNOSIS — I471 Supraventricular tachycardia: Secondary | ICD-10-CM

## 2021-04-06 ENCOUNTER — Telehealth: Payer: Self-pay

## 2021-04-06 NOTE — Telephone Encounter (Signed)
Patient called requesting echocardiogram results. Please advise.

## 2021-04-06 NOTE — Telephone Encounter (Signed)
Spoke with the patient

## 2021-06-05 ENCOUNTER — Other Ambulatory Visit: Payer: Self-pay | Admitting: Gastroenterology

## 2021-06-29 ENCOUNTER — Other Ambulatory Visit: Payer: Self-pay | Admitting: Gastroenterology

## 2022-01-28 ENCOUNTER — Emergency Department (HOSPITAL_COMMUNITY): Payer: Medicare Other

## 2022-01-28 ENCOUNTER — Encounter (HOSPITAL_COMMUNITY): Payer: Self-pay

## 2022-01-28 ENCOUNTER — Other Ambulatory Visit: Payer: Self-pay

## 2022-01-28 ENCOUNTER — Emergency Department (HOSPITAL_COMMUNITY)
Admission: EM | Admit: 2022-01-28 | Discharge: 2022-01-28 | Disposition: A | Discharge status: Home / Self Care | Payer: Medicare Other | Attending: Emergency Medicine | Admitting: Emergency Medicine

## 2022-01-28 DIAGNOSIS — Z79899 Other long term (current) drug therapy: Secondary | ICD-10-CM | POA: Insufficient documentation

## 2022-01-28 DIAGNOSIS — Z20822 Contact with and (suspected) exposure to covid-19: Secondary | ICD-10-CM | POA: Diagnosis not present

## 2022-01-28 DIAGNOSIS — H532 Diplopia: Secondary | ICD-10-CM | POA: Diagnosis present

## 2022-01-28 DIAGNOSIS — G529 Cranial nerve disorder, unspecified: Secondary | ICD-10-CM | POA: Insufficient documentation

## 2022-01-28 LAB — I-STAT CHEM 8, ED
BUN: 8 mg/dL (ref 8–23)
Calcium, Ion: 1.24 mmol/L (ref 1.15–1.40)
Chloride: 101 mmol/L (ref 98–111)
Creatinine, Ser: 0.8 mg/dL (ref 0.61–1.24)
Glucose, Bld: 111 mg/dL — ABNORMAL HIGH (ref 70–99)
HCT: 48 % (ref 39.0–52.0)
Hemoglobin: 16.3 g/dL (ref 13.0–17.0)
Potassium: 4.1 mmol/L (ref 3.5–5.1)
Sodium: 141 mmol/L (ref 135–145)
TCO2: 30 mmol/L (ref 22–32)

## 2022-01-28 LAB — DIFFERENTIAL
Abs Immature Granulocytes: 0.03 10*3/uL (ref 0.00–0.07)
Basophils Absolute: 0 10*3/uL (ref 0.0–0.1)
Basophils Relative: 0 %
Eosinophils Absolute: 0.1 10*3/uL (ref 0.0–0.5)
Eosinophils Relative: 3 %
Immature Granulocytes: 1 %
Lymphocytes Relative: 33 %
Lymphs Abs: 1.5 10*3/uL (ref 0.7–4.0)
Monocytes Absolute: 0.4 10*3/uL (ref 0.1–1.0)
Monocytes Relative: 9 %
Neutro Abs: 2.5 10*3/uL (ref 1.7–7.7)
Neutrophils Relative %: 54 %

## 2022-01-28 LAB — URINALYSIS, ROUTINE W REFLEX MICROSCOPIC
Bilirubin Urine: NEGATIVE
Glucose, UA: NEGATIVE mg/dL
Hgb urine dipstick: NEGATIVE
Ketones, ur: NEGATIVE mg/dL
Leukocytes,Ua: NEGATIVE
Nitrite: NEGATIVE
Protein, ur: NEGATIVE mg/dL
Specific Gravity, Urine: 1.032 — ABNORMAL HIGH (ref 1.005–1.030)
pH: 5 (ref 5.0–8.0)

## 2022-01-28 LAB — COMPREHENSIVE METABOLIC PANEL
ALT: 32 U/L (ref 0–44)
AST: 36 U/L (ref 15–41)
Albumin: 3.9 g/dL (ref 3.5–5.0)
Alkaline Phosphatase: 55 U/L (ref 38–126)
Anion gap: 6 (ref 5–15)
BUN: 10 mg/dL (ref 8–23)
CO2: 29 mmol/L (ref 22–32)
Calcium: 9 mg/dL (ref 8.9–10.3)
Chloride: 103 mmol/L (ref 98–111)
Creatinine, Ser: 0.83 mg/dL (ref 0.61–1.24)
GFR, Estimated: >60 mL/min (ref >60)
Glucose, Bld: 113 mg/dL — ABNORMAL HIGH (ref 70–99)
Potassium: 4 mmol/L (ref 3.5–5.1)
Sodium: 138 mmol/L (ref 135–145)
Total Bilirubin: 1.2 mg/dL (ref 0.3–1.2)
Total Protein: 7.1 g/dL (ref 6.5–8.1)

## 2022-01-28 LAB — RAPID URINE DRUG SCREEN, HOSP PERFORMED
Amphetamines: NOT DETECTED
Barbiturates: NOT DETECTED
Benzodiazepines: POSITIVE — AB
Cocaine: NOT DETECTED
Opiates: NOT DETECTED
Tetrahydrocannabinol: POSITIVE — AB

## 2022-01-28 LAB — RESP PANEL BY RT-PCR (FLU A&B, COVID) ARPGX2
Influenza A by PCR: NEGATIVE
Influenza B by PCR: NEGATIVE
SARS Coronavirus 2 by RT PCR: NEGATIVE

## 2022-01-28 LAB — CBC
HCT: 48.2 % (ref 39.0–52.0)
Hemoglobin: 16.1 g/dL (ref 13.0–17.0)
MCH: 31.1 pg (ref 26.0–34.0)
MCHC: 33.4 g/dL (ref 30.0–36.0)
MCV: 93.2 fL (ref 80.0–100.0)
Platelets: 142 10*3/uL — ABNORMAL LOW (ref 150–400)
RBC: 5.17 MIL/uL (ref 4.22–5.81)
RDW: 12.9 % (ref 11.5–15.5)
WBC: 4.6 10*3/uL (ref 4.0–10.5)
nRBC: 0 % (ref 0.0–0.2)

## 2022-01-28 LAB — ETHANOL: Alcohol, Ethyl (B): <10 mg/dL (ref <10)

## 2022-01-28 LAB — PROTIME-INR
INR: 1.1 (ref 0.8–1.2)
Prothrombin Time: 13.8 seconds (ref 11.4–15.2)

## 2022-01-28 LAB — APTT: aPTT: 26 seconds (ref 24–36)

## 2022-01-28 MED ORDER — IOHEXOL 350 MG/ML SOLN
75.0000 mL | Freq: Once | INTRAVENOUS | Status: AC | PRN
  Administered 2022-01-28: 75 mL via INTRAVENOUS

## 2022-01-28 MED ORDER — LORAZEPAM 2 MG/ML IJ SOLN
0.5000 mg | Freq: Once | INTRAMUSCULAR | Status: AC | PRN
  Administered 2022-01-28: 0.5 mg via INTRAVENOUS
  Filled 2022-01-28: qty 1

## 2022-01-28 NOTE — ED Provider Notes (Signed)
[facility] DEPT Provider Note   CSN: [id number] Arrival date & time: 01/28/22  1113     History  Chief Complaint  Patient presents with   Diplopia   headache   Nausea    Joseph Mccall is a 71 y.o. male.  71 yo M with a chief complaints of double vision.  Patient noticed this a couple days ago.  He said he had some very bright overhead lights and then felt like his vision was a bit off and when he got home he realized he was seeing double.  Feels like there horizontal the images.  He developed a bit of a headache with continued use of his eyes and felt like this improved when he closes them.  He patch his eye this morning and feels that it has improved.  He feels like the double vision resolves whenever he closes either eye.  He denies head injury denies fever denies cough or congestion.  Denies difficulty breathing.  The history is provided by the patient and the spouse.      Home Medications Prior to Admission medications   Medication Sig Start Date End Date Taking? Authorizing Provider  baclofen (LIORESAL) 10 MG tablet Take 10 mg by mouth daily as needed for muscle spasms. 08/16/19  Yes [provider]  Ciclopirox 1 % shampoo Apply 1 application topically daily as needed (rash).    Yes [provider]  diazepam (VALIUM) 5 MG tablet Take 5 mg by mouth every 6 (six) hours as needed for anxiety.   Yes [provider]  dicyclomine (BENTYL) 20 MG tablet Take 1 tablet (20 mg total) by mouth 3 (three) times daily as needed for spasms. 04/17/20  Yes Ladene Artist, MD  fluticasone (CUTIVATE) 0.05 % cream Apply 1 application topically daily as needed (For rash on skin). 09/04/21  Yes [provider]  pantoprazole (PROTONIX) 40 MG tablet Take 1 tablet (40 mg total) by mouth daily. MUST HAVE OFFICE VISIT FOR ADDITIONAL REFILLS Patient taking differently: Take 40 mg by mouth daily. 06/05/21  Yes Ladene Artist, MD  RESTORA RX  60-1.25 MG CAPS Take 1 capsule by mouth daily. 11/26/21  Yes [provider]  metoprolol tartrate (LOPRESSOR) 25 MG tablet Take 1 tablet (25 mg total) by mouth 2 (two) times daily. Patient not taking: Reported on 01/24/2021 12/18/20 03/18/21  Nigel Mormon, MD  NEOMYCIN-POLYMYXIN-HYDROCORTISONE (CORTISPORIN) 1 % SOLN OTIC solution Apply 1-2 drops to toe BID after soaking Patient not taking: Reported on 01/24/2021 12/21/19   Garrel Ridgel, DPM      Allergies    Naltrexone-bupropion hcl er    Review of Systems   Review of Systems  Physical Exam Updated Vital Signs BP (!) 146/80    Pulse 64    Temp 98.1 F (36.7 C) (Oral)    Resp 18    Ht 5\' 9"  (1.753 m)    Wt 120.2 kg    SpO2 98%    BMI 39.13 kg/m  Physical Exam Vitals and nursing note reviewed.  Constitutional:      Appearance: He is well-developed.  HENT:     Head: Normocephalic and atraumatic.  Eyes:     Pupils: Pupils are equal, round, and reactive to light.  Neck:     Vascular: No JVD.  Cardiovascular:     Rate and Rhythm: Normal rate and regular rhythm.     Heart sounds: No murmur heard.   No friction rub. No  gallop.  Pulmonary:     Effort: No respiratory distress.     Breath sounds: No wheezing.  Abdominal:     General: There is no distension.     Tenderness: There is no abdominal tenderness. There is no guarding or rebound.  Musculoskeletal:        General: Normal range of motion.     Cervical back: Normal range of motion and neck supple.  Skin:    Coloration: Skin is not pale.     Findings: No rash.  Neurological:     Mental Status: He is alert and oriented to person, place, and time.     Comments: Disconjugate gaze especially when looking far to the right.  Some weakness in the right and does not fully AB duct.  Psychiatric:        Behavior: Behavior normal.    ED Results / Procedures / Treatments   Labs (all labs ordered are listed, but only abnormal results are displayed) Labs Reviewed  CBC  - Abnormal; Notable for the following components:      Result Value   Platelets 142 (*)    All other components within normal limits  COMPREHENSIVE METABOLIC PANEL - Abnormal; Notable for the following components:   Glucose, Bld 113 (*)    All other components within normal limits  RAPID URINE DRUG SCREEN, HOSP PERFORMED - Abnormal; Notable for the following components:   Benzodiazepines POSITIVE (*)    Tetrahydrocannabinol POSITIVE (*)    All other components within normal limits  URINALYSIS, ROUTINE W REFLEX MICROSCOPIC - Abnormal; Notable for the following components:   Specific Gravity, Urine 1.032 (*)    All other components within normal limits  I-STAT CHEM 8, ED - Abnormal; Notable for the following components:   Glucose, Bld 111 (*)    All other components within normal limits  RESP PANEL BY RT-PCR (FLU A&B, COVID) ARPGX2  ETHANOL  PROTIME-INR  APTT  DIFFERENTIAL    EKG EKG Interpretation  Date/Time:  Monday January 28 2022 12:05:39 EST Ventricular Rate:  65 PR Interval:  213 QRS Duration: 104 QT Interval:  425 QTC Calculation: 442 R Axis:   -2 Text Interpretation: Sinus rhythm Borderline prolonged PR interval No old tracing to compare Confirmed by Deno Etienne [phone]) on 01/28/2022 12:17:27 PM  Radiology CT Angio Head W or Wo Contrast  Result Date: 01/28/2022 CLINICAL DATA:  Neuro deficit, acute, stroke suspected. Double vision. Right cranial nerve 6 palsy. EXAM: CT ANGIOGRAPHY HEAD AND NECK TECHNIQUE: Multidetector CT imaging of the head and neck was performed using the standard protocol during bolus administration of intravenous contrast. Multiplanar CT image reconstructions and MIPs were obtained to evaluate the vascular anatomy. Carotid stenosis measurements (when applicable) are obtained utilizing NASCET criteria, using the distal internal carotid diameter as the denominator. RADIATION DOSE REDUCTION: This exam was performed according to the departmental  dose-optimization program which includes automated exposure control, adjustment of the mA and/or kV according to patient size and/or use of iterative reconstruction technique. CONTRAST:  15mL OMNIPAQUE IOHEXOL 350 MG/ML SOLN COMPARISON:  Head CT earlier same day FINDINGS: CTA NECK FINDINGS Aortic arch: Aortic arch is normal.  Branching pattern is normal. Right carotid system: Common carotid artery widely patent to the bifurcation. Calcified plaque at the carotid bifurcation and ICA bulb. Minimal diameter of the ICA bulb measures 4.3 mm. Compared to a more distal cervical ICA diameter of 5.2 mm, this indicates a 20% stenosis. Cervical ICA widely patent beyond that. Left carotid  system: Common carotid artery widely patent to the bifurcation. Calcified plaque at the bifurcation and ICA bulb. Minimal diameter in the ICA bulb measures 4.1 mm. Compared to a more distal cervical ICA diameter of the same, there is no stenosis. Vertebral arteries: Both vertebral artery origins are widely patent. Both vertebral arteries appear normal through the cervical region to the foramen magnum. Skeleton: Ordinary degenerative spondylosis. Other neck: No mass or lymphadenopathy. Upper chest: Lung apices are clear. Review of the MIP images confirms the above findings CTA HEAD FINDINGS Anterior circulation: Both internal carotid arteries are patent through the skull base and siphon regions. Minimal siphon atherosclerotic calcification but no stenosis. The anterior and middle cerebral vessels are patent. No proximal stenosis, aneurysm or vascular malformation. Right PCA does receives primary supply from the anterior circulation, but there is no evidence of aneurysm. Posterior circulation: Both vertebral arteries are patent through the foramen magnum to the basilar. No basilar stenosis. Posterior circulation branch vessels are normal. Right PCA takes fetal origin as noted above. Venous sinuses: Patent and normal. Anatomic variants: None  significant otherwise. Review of the MIP images confirms the above findings IMPRESSION: No abnormality seen to explain the clinical presentation. Mild atherosclerotic disease at both carotid bifurcations. 20% stenosis of the proximal ICA on the right. No stenosis on the left. No evidence of aneurysm to explain right sixth nerve palsy. Electronically Signed   By: Nelson Chimes M.D.   On: 01/28/2022 14:02   CT HEAD WO CONTRAST  Result Date: 01/28/2022 CLINICAL DATA:  Neuro deficit, acute stroke suspected, right cranial nerve VI palsy. Double vision. EXAM: CT HEAD WITHOUT CONTRAST TECHNIQUE: Contiguous axial images were obtained from the base of the skull through the vertex without intravenous contrast. RADIATION DOSE REDUCTION: This exam was performed according to the departmental dose-optimization program which includes automated exposure control, adjustment of the mA and/or kV according to patient size and/or use of iterative reconstruction technique. COMPARISON:  None. FINDINGS: Brain: No evidence of acute infarction, hemorrhage, hydrocephalus, extra-axial collection or mass lesion/mass effect. Vascular: No hyperdense vessel or unexpected calcification. Skull: Normal. Negative for fracture or focal lesion. Sinuses/Orbits: No acute finding. Other: None. IMPRESSION: No acute intracranial abnormality. MRI examination could be considered for further evaluation if clinically warranted. Electronically Signed   By: Keane Police D.O.   On: 01/28/2022 12:22   CT Angio Neck W and/or Wo Contrast  Result Date: 01/28/2022 CLINICAL DATA:  Neuro deficit, acute, stroke suspected. Double vision. Right cranial nerve 6 palsy. EXAM: CT ANGIOGRAPHY HEAD AND NECK TECHNIQUE: Multidetector CT imaging of the head and neck was performed using the standard protocol during bolus administration of intravenous contrast. Multiplanar CT image reconstructions and MIPs were obtained to evaluate the vascular anatomy. Carotid stenosis  measurements (when applicable) are obtained utilizing NASCET criteria, using the distal internal carotid diameter as the denominator. RADIATION DOSE REDUCTION: This exam was performed according to the departmental dose-optimization program which includes automated exposure control, adjustment of the mA and/or kV according to patient size and/or use of iterative reconstruction technique. CONTRAST:  49mL OMNIPAQUE IOHEXOL 350 MG/ML SOLN COMPARISON:  Head CT earlier same day FINDINGS: CTA NECK FINDINGS Aortic arch: Aortic arch is normal.  Branching pattern is normal. Right carotid system: Common carotid artery widely patent to the bifurcation. Calcified plaque at the carotid bifurcation and ICA bulb. Minimal diameter of the ICA bulb measures 4.3 mm. Compared to a more distal cervical ICA diameter of 5.2 mm, this indicates a 20% stenosis. Cervical ICA  widely patent beyond that. Left carotid system: Common carotid artery widely patent to the bifurcation. Calcified plaque at the bifurcation and ICA bulb. Minimal diameter in the ICA bulb measures 4.1 mm. Compared to a more distal cervical ICA diameter of the same, there is no stenosis. Vertebral arteries: Both vertebral artery origins are widely patent. Both vertebral arteries appear normal through the cervical region to the foramen magnum. Skeleton: Ordinary degenerative spondylosis. Other neck: No mass or lymphadenopathy. Upper chest: Lung apices are clear. Review of the MIP images confirms the above findings CTA HEAD FINDINGS Anterior circulation: Both internal carotid arteries are patent through the skull base and siphon regions. Minimal siphon atherosclerotic calcification but no stenosis. The anterior and middle cerebral vessels are patent. No proximal stenosis, aneurysm or vascular malformation. Right PCA does receives primary supply from the anterior circulation, but there is no evidence of aneurysm. Posterior circulation: Both vertebral arteries are patent through  the foramen magnum to the basilar. No basilar stenosis. Posterior circulation branch vessels are normal. Right PCA takes fetal origin as noted above. Venous sinuses: Patent and normal. Anatomic variants: None significant otherwise. Review of the MIP images confirms the above findings IMPRESSION: No abnormality seen to explain the clinical presentation. Mild atherosclerotic disease at both carotid bifurcations. 20% stenosis of the proximal ICA on the right. No stenosis on the left. No evidence of aneurysm to explain right sixth nerve palsy. Electronically Signed   By: Nelson Chimes M.D.   On: 01/28/2022 14:02   MR BRAIN WO CONTRAST  Result Date: 01/28/2022 CLINICAL DATA:  Neuro deficit, acute, stroke suspected EXAM: MRI HEAD WITHOUT CONTRAST TECHNIQUE: Multiplanar, multiecho pulse sequences of the brain and surrounding structures were obtained without intravenous contrast. COMPARISON:  Same day CT/CTA. FINDINGS: Motion limited study.  Within this limitation: Brain: No evidence of acute infarction, acute hemorrhage, hydrocephalus, extra-axial collection or mass lesion. Mild predominantly periventricular T2/FLAIR hyperintensities. Vascular: Major arterial flow voids are maintained skull base. Further evaluated on same day CTA. Skull and upper cervical spine: Diffuse T1 hypointensity of the bone marrow. No focal marrow replacing lesion. Sinuses/Orbits: Clear sinuses.  Unremarkable orbits. Other: No mastoid effusions. IMPRESSION: 1. No evidence of acute intracranial abnormality on this motion limited study. 2. Mild predominantly periventricular T2/FLAIR hyperintensities, which are nonspecific. Primary differential considerations include chronic microvascular ischemic disease and chronic demyelination. 3. Diffuse T1 hypointensity of the bone marrow, which is nonspecific but most commonly secondary to chronic anemia, chronic hypoxia (such as in smokers), and/or obesity. Underlying lymphoproliferative disorder is a less  likely differential consideration. Electronically Signed   By: Margaretha Sheffield M.D.   On: 01/28/2022 16:13    Procedures Procedures    Medications Ordered in ED Medications  iohexol (OMNIPAQUE) 350 MG/ML injection 75 mL (75 mLs Intravenous Contrast Given 01/28/22 1338)  LORazepam (ATIVAN) injection 0.5 mg (0.5 mg Intravenous Given 01/28/22 1531)    ED Course/ Medical Decision Making/ A&P Clinical Course as of 01/28/22 1618  Mon Jan 28, 2022  1613 [place] for CNIX palsy, horizontal diplopia, pending MRI; if neg outpatient neuro fu (collins) [MK]    Clinical Course User Index [MK] Kommor, Debe Coder, MD                           Medical Decision Making Amount and/or Complexity of Data Reviewed Labs: ordered. Radiology: ordered.  Risk Prescription drug management.   Patient is a 71 y.o. male with a cc of double vision.  Been  going on for about 48 hours now.  Started when he was under some bright lights.  Denies unilateral eye pain.  He covered his right eye with improvement but states that he covers the left eye he also has improvement.  He denies trauma.  On my exam I am concerned for cranial 6th nerve palsy on the right.  We will obtain a CT scan of the head.  Blood work.  Discussed with neurology. Significant PMH of polycythemia vera.  Currently under surveillance not requiring any treatment..  I reviewed the patients chart and most recent hematology note documents that he likely has polycythemia due to sleep apnea smoking and possible alcohol intake.  I discussed the case with Dr. Theda Sers, neurology.  Recommended an MRI of the brain without contrast to evaluate for other pathology.  I did discuss imaging of the vasculature as well which she thought was reasonable.  We will obtain a CT angiogram of the head and neck.  MRI of the brain.  I independently interpreted the patients labs and imaging CT of the head was without intracranial hemorrhage or obvious mass.  No significant renal  dysfunction no significant electrolyte abnormality.  No significant anemia.  COVID-negative.  MRI without specific finding.  Will have the patient follow-up with outpatient neurology.  4:18 PM:  I have discussed the diagnosis/risks/treatment options with the patient and family.  Evaluation and diagnostic testing in the emergency department does not suggest an emergent condition requiring admission or immediate intervention beyond what has been performed at this time.  They will follow up with  Neuro. We also discussed returning to the ED immediately if new or worsening sx occur. We discussed the sx which are most concerning (e.g., sudden worsening pain, fever, inability to tolerate by mouth) that necessitate immediate return. Medications administered to the patient during their visit and any new prescriptions provided to the patient are listed below.  Medications given during this visit Medications  iohexol (OMNIPAQUE) 350 MG/ML injection 75 mL (75 mLs Intravenous Contrast Given 01/28/22 1338)  LORazepam (ATIVAN) injection 0.5 mg (0.5 mg Intravenous Given 01/28/22 1531)     The patient appears reasonably screen and/or stabilized for discharge and I doubt any other medical condition or other [facility] requiring further screening, evaluation, or treatment in the ED at this time prior to discharge.          Final Clinical Impression(s) / ED Diagnoses Final diagnoses:  Cranial nerve palsy    Rx / DC Orders ED Discharge Orders          Ordered    Ambulatory referral to Neurology       Comments: R cranial nerve palsy   01/28/22 [place], DO 01/28/22 1618

## 2022-01-28 NOTE — ED Triage Notes (Signed)
Patient c/o double vision, headache a, and nausea x 2 days. Patient placed a patch to the right eye to help with the double vision. Patient also c/o Headache and nausea.  Sats in triage 88%. Patient denies any SOB or cough. Patient placed on O2 2L/min and sats increased to 90%. O2 increased to 3L/min and Sats increased t 95%.

## 2022-01-28 NOTE — Discharge Instructions (Signed)
We did not see any obvious concerning finding on imaging for your double vision.  I have placed a referral to see the neurologist in the office.  Please follow-up with your family doctor.

## 2022-02-04 ENCOUNTER — Ambulatory Visit: Payer: Medicare Other | Admitting: Neurology

## 2022-02-04 ENCOUNTER — Encounter: Payer: Self-pay | Admitting: Neurology

## 2022-02-04 VITALS — BP 137/81 | HR 77 | Ht 69.0 in | Wt 263.0 lb

## 2022-02-04 DIAGNOSIS — H4921 Sixth [abducent] nerve palsy, right eye: Secondary | ICD-10-CM | POA: Insufficient documentation

## 2022-02-04 DIAGNOSIS — G4733 Obstructive sleep apnea (adult) (pediatric): Secondary | ICD-10-CM

## 2022-02-04 NOTE — Progress Notes (Signed)
Chief Complaint  Patient presents with   Consult    Room 14 with wife, Rod Holler. ED referral for right cranial nerve palsy. Established patient of Dr. Brett Fairy for OSA.      ASSESSMENT AND PLAN  Joseph Mccall is a 71 y.o. 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   MEDICAL HISTORY:  Joseph Mccall is a 71 year old male, accompanied by his wife, seen in request by Dr. Deno Etienne, DO For acute onset of double vision, his primary care physician is Dr. Reynold Bowen, initial evaluation was on February 04, 2022  I reviewed and summarized the referring note. PMHX. Gerd Anxiety valium prn 2-3 times a week. Pre-Dm Obstructive apnea, could not tolerate CPAP.  He works a sedentary job, Lexicographer, on January 26, 2022, while he was standing at the take out of the restroom, he noticed fuzziness looking at the overhead bright light, he was able to drive back home, by then, he noticed frank horizontal double vision, at the same time he felt nausea, difficulty focusing,  He slept through the night, next day on January 29, 30s, his double vision persistent  He was evaluated at emergency room,  I personally reviewed MRI of the brain without contrast January 28, 2022: No acute abnormality, mild periventricular small vessel disease  Laboratory evaluation, UDS was positive for benzodiazepine and marijuana, he is taking low-dose Valium as needed for anxiety CBC, hemoglobin of 16.1, mildly decreased platelet 142, CMP within normal limit,  creatinine of 0.83   PHYSICAL EXAM:   Vitals:   02/04/22 0927  BP: 137/81  Pulse: 77  Weight: 263 lb (119.3 kg)  Height: 5\' 9"  (1.753 m)   Not recorded     Body mass index is 38.84 kg/m.  PHYSICAL EXAMNIATION:  Gen: NAD, conversant, well nourised, well groomed                     Cardiovascular: Regular rate rhythm, no peripheral edema, warm, nontender. Eyes: Conjunctivae clear without exudates or hemorrhage Neck: Supple, no carotid bruits. Pulmonary: Clear to auscultation bilaterally   NEUROLOGICAL EXAM:  MENTAL STATUS: Central obesity, Speech:    Speech is normal; fluent and spontaneous with normal comprehension.  Cognition:     Orientation to time, place and person     Normal recent and remote memory     Normal Attention span and concentration     Normal Language, naming, repeating,spontaneous speech     Fund of knowledge   CRANIAL NERVES: CN II: Visual fields are full to confrontation. Pupils are round equal and briskly reactive to light. CN III, IV, VI: Right lateral abductor paralysis, CN V: Facial sensation is intact to light touch CN VII: Face is symmetric with normal eye closure  CN VIII: Hearing is normal to causal conversation. CN IX, X: Phonation is normal. CN XI: Head turning and shoulder shrug are intact CN XII: Narrow oropharyngeal space  MOTOR: There is no pronator drift of out-stretched arms. Muscle bulk and tone are normal. Muscle strength is normal.  REFLEXES: Reflexes are 2+ and symmetric at the biceps, triceps, knees, and ankles. Plantar responses are flexor.  SENSORY: Intact to light touch, pinprick and vibratory sensation are intact in fingers and toes.  COORDINATION: There is no trunk or limb dysmetria noted.  GAIT/STANCE: He can get up from seated position arm crossed, mildly unsteady, limited by his big body habitus  REVIEW OF SYSTEMS:  Full 14 system review of systems performed and notable only for as above All other review  of systems were negative.   ALLERGIES: Allergies  Allergen Reactions   Naltrexone-Bupropion Hcl Er     Other reaction(s): Anxiety, insonmia    HOME MEDICATIONS: Current Outpatient Medications  Medication Sig Dispense Refill   baclofen (LIORESAL) 10 MG tablet Take 10 mg by mouth daily as needed for muscle spasms.     Ciclopirox 1 % shampoo Apply 1 application topically daily as needed (rash).      diazepam (VALIUM) 5 MG tablet Take 5 mg by mouth every 6 (six) hours as needed for anxiety.     dicyclomine (BENTYL) 20 MG tablet Take 1 tablet (20 mg total) by mouth 3 (three) times daily as needed for spasms. 90 tablet 11   fluticasone (CUTIVATE) 0.05 % cream Apply 1 application topically daily as needed (For rash on skin).     NEOMYCIN-POLYMYXIN-HYDROCORTISONE (CORTISPORIN) 1 % SOLN OTIC solution Apply 1-2 drops to toe BID after soaking 10 mL 1   pantoprazole (PROTONIX) 40 MG tablet Take 1 tablet (40 mg total) by mouth daily. MUST HAVE OFFICE VISIT FOR ADDITIONAL REFILLS 30 tablet 0   RESTORA RX 60-1.25 MG CAPS Take 1 capsule by mouth daily.     No current facility-administered medications for this visit.    PAST MEDICAL HISTORY: Past Medical History:  Diagnosis Date   Abnormal transaminases    Anxiety and depression    Arthralgia    Arthritis    knees   Bilateral inguinal hernia 06/03/2018   Cataract    removed both eyes   Chest pain    Diastolic dysfunction 41/28/7867   Moderate, Noted on ECHO   Diverticulosis 01/04/2019   Mild, left colon   Fatty liver 12/07/2018   Mild, Noted on CT Abd   GERD (gastroesophageal reflux disease)    Glucose intolerance (impaired glucose tolerance)    History of colonic polyps    Hyperlipidemia    Hypothyroidism    No medication   Insomnia    Irregular heart beat    reduced caffefine intake has helped   Knee pain, bilateral    LVH (left ventricular hypertrophy) 12/12/2015   Mild, noted ECHO    Malaise and fatigue    Obesity    Sleep  apnea    no cpap-last sleep test di dnot indicate sleep apnea   Tinnitus    Umbilical hernia 67/20/9470   Urinary hesitancy     PAST SURGICAL HISTORY: Past Surgical History:  Procedure Laterality Date   CATARACT EXTRACTION, BILATERAL     CHOLECYSTECTOMY N/A 05/06/2019   Procedure: LAPAROSCOPIC CHOLECYSTECTOMY;  Surgeon: Coralie Keens, MD;  Location: [facility];  Service: General;  Laterality: N/A;   COLON BIOPSY     colon polyp removal     COLONOSCOPY W/ POLYPECTOMY  01/04/2019   POLYPECTOMY     UPPER GI ENDOSCOPY  01/04/2019    FAMILY HISTORY: Family History  Problem Relation Age of Onset   Lung disease Father        pulmonary fibrosis   Colon  cancer Paternal Aunt    Stomach cancer Maternal Grandmother    Colon polyps Neg Hx    Esophageal cancer Neg Hx    Rectal cancer Neg Hx     SOCIAL HISTORY: Social History   Socioeconomic History   Marital status: Married    Spouse name: Not on file   Number of children: 0   Years of education: college   Highest education level: Not on file  Occupational History   Not on file  Tobacco Use   Smoking status: Former    Packs/day: 1.00    Years: 10.00    Pack years: 10.00    Types: Cigarettes    Quit date: 12/30/1988    Years since quitting: 33.1   Smokeless tobacco: Never  Vaping Use   Vaping Use: Never used  Substance and Sexual Activity   Alcohol use: Yes    Alcohol/week: 15.0 standard drinks    Types: 15 Cans of beer per week    Comment: 15 beers per week    Drug use: No   Sexual activity: Not on file  Other Topics Concern   Not on file  Social History Narrative   Denies caffeine consumption.   Lives with wife.   Right-handed.   Social Determinants of Health   Financial Resource Strain: Not on file  Food Insecurity: Not on file  Transportation Needs: Not on file  Physical Activity: Not on file  Stress: Not on file  Social Connections: Not on file  Intimate Partner Violence: Not on file      Marcial Pacas,  M.D. Ph.D.  [facility] [address] [place], [place] [address] Ph: [phone] Fax: [phone]  CC:  Deno Etienne, DO [place],  [place] [address]  Reynold Bowen, MD

## 2022-02-04 NOTE — Patient Instructions (Signed)
Start Asa 81mg  daily Increase water intake Moderate exercise.

## 2022-02-05 ENCOUNTER — Telehealth: Payer: Self-pay | Admitting: Neurology

## 2022-02-05 LAB — CBC WITH DIFFERENTIAL/PLATELET
Basophils Absolute: 0 10*3/uL (ref 0.0–0.2)
Basos: 1 %
EOS (ABSOLUTE): 0.2 10*3/uL (ref 0.0–0.4)
Eos: 4 %
Hematocrit: 49.4 % (ref 37.5–51.0)
Hemoglobin: 16.5 g/dL (ref 13.0–17.7)
Immature Grans (Abs): 0 10*3/uL (ref 0.0–0.1)
Immature Granulocytes: 1 %
Lymphocytes Absolute: 1.4 10*3/uL (ref 0.7–3.1)
Lymphs: 37 %
MCH: 31 pg (ref 26.6–33.0)
MCHC: 33.4 g/dL (ref 31.5–35.7)
MCV: 93 fL (ref 79–97)
Monocytes Absolute: 0.3 10*3/uL (ref 0.1–0.9)
Monocytes: 9 %
Neutrophils Absolute: 1.9 10*3/uL (ref 1.4–7.0)
Neutrophils: 48 %
Platelets: 130 10*3/uL — ABNORMAL LOW (ref 150–450)
RBC: 5.33 x10E6/uL (ref 4.14–5.80)
RDW: 13.1 % (ref 11.6–15.4)
WBC: 3.9 10*3/uL (ref 3.4–10.8)

## 2022-02-05 LAB — SYPHILIS: RPR W/REFLEX TO RPR TITER AND TREPONEMAL ANTIBODIES, TRADITIONAL SCREENING AND DIAGNOSIS ALGORITHM: RPR Ser Ql: NONREACTIVE

## 2022-02-05 LAB — SEDIMENTATION RATE: Sed Rate: 2 mm/hr (ref 0–30)

## 2022-02-05 LAB — C-REACTIVE PROTEIN: CRP: 2 mg/L (ref 0–10)

## 2022-02-05 LAB — ANA W/REFLEX IF POSITIVE: Anti Nuclear Antibody (ANA): NEGATIVE

## 2022-02-05 LAB — HGB A1C W/O EAG: Hgb A1c MFr Bld: 5.8 % — ABNORMAL HIGH (ref 4.8–5.6)

## 2022-02-05 LAB — VITAMIN B12: Vitamin B-12: 208 pg/mL — ABNORMAL LOW (ref 232–1245)

## 2022-02-05 LAB — TSH: TSH: 8.39 u[IU]/mL — ABNORMAL HIGH (ref 0.450–4.500)

## 2022-02-05 NOTE — Telephone Encounter (Signed)
Pt asking for a call to discuss results showing on Mychart

## 2022-02-05 NOTE — Telephone Encounter (Signed)
I called patient. I discussed his lab results and recommendations with him. He will follow up with Dr. Forde Dandy for further recommendations on his labs. Pt verbalized understanding of results. Pt had no questions at this time but was encouraged to call back if questions arise.

## 2022-02-05 NOTE — Telephone Encounter (Signed)
Please call patient, laboratory evaluation showed  1, low vitamin B12 208, he will benefit IM B12 supplement, this can be done at primary care's office  2, elevated TSH, indicating hypothyroidism,  3, mildly elevated A1c 5.8, indicating mild elevated glucose level over the past few months, should start by exercise, diet  I have forwarded the lab result to his primary care Reynold Bowen, MD he should contact his primary care doctor [place] for further evaluation

## 2022-02-07 ENCOUNTER — Telehealth: Payer: Self-pay | Admitting: Neurology

## 2022-02-07 NOTE — Telephone Encounter (Signed)
[employer] medicare no Roxanna Mew, sent Butch Penny she will reach out to the patient to schedule.

## 2022-02-14 ENCOUNTER — Ambulatory Visit (HOSPITAL_COMMUNITY)
Admission: RE | Admit: 2022-02-14 | Discharge: 2022-02-14 | Disposition: A | Discharge status: Home / Self Care | Payer: Medicare Other | Source: Ambulatory Visit | Attending: Neurology | Admitting: Neurology

## 2022-02-14 ENCOUNTER — Other Ambulatory Visit: Payer: Self-pay

## 2022-02-14 DIAGNOSIS — E785 Hyperlipidemia, unspecified: Secondary | ICD-10-CM | POA: Insufficient documentation

## 2022-02-14 DIAGNOSIS — H4921 Sixth [abducent] nerve palsy, right eye: Secondary | ICD-10-CM | POA: Diagnosis not present

## 2022-02-14 DIAGNOSIS — G459 Transient cerebral ischemic attack, unspecified: Secondary | ICD-10-CM | POA: Insufficient documentation

## 2022-02-14 LAB — ECHOCARDIOGRAM COMPLETE
Area-P 1/2: 3.03 cm2
S' Lateral: 3.5 cm

## 2022-08-06 ENCOUNTER — Ambulatory Visit: Payer: Medicare Other | Admitting: Adult Health

## 2022-10-05 IMAGING — CT CT ANGIO HEAD
2 of 7 series · 8 of 33 positions shown · non-contrast
Comparison: Head CT earlier same day

CLINICAL DATA: Neuro deficit, acute, stroke suspected. Double
vision. Right cranial nerve 6 palsy.

EXAM:
CT ANGIOGRAPHY HEAD AND NECK
TECHNIQUE: Multidetector CT imaging of the head and neck was performed using
the standard protocol during bolus administration of intravenous
contrast. Multiplanar CT image reconstructions and MIPs were
obtained to evaluate the vascular anatomy. Carotid stenosis
measurements (when applicable) are obtained utilizing NASCET
criteria, using the distal internal carotid diameter as the
denominator.

[Series 5: cta head neck · axial · 0.43mm/px · z∈[-283,-173]mm · 2 of 166 slices shown]
[im 56/166  soft-tissue]
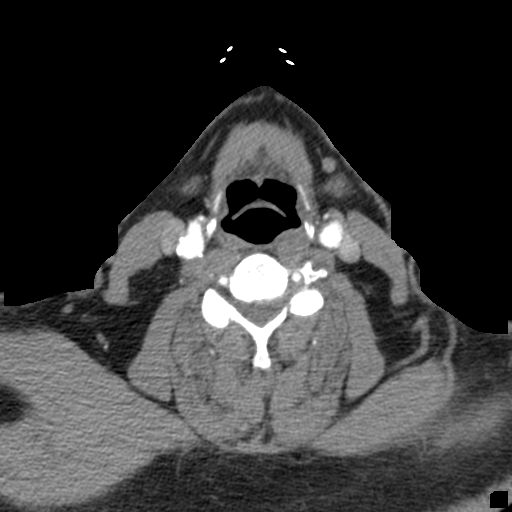
[im 111/166  soft-tissue]
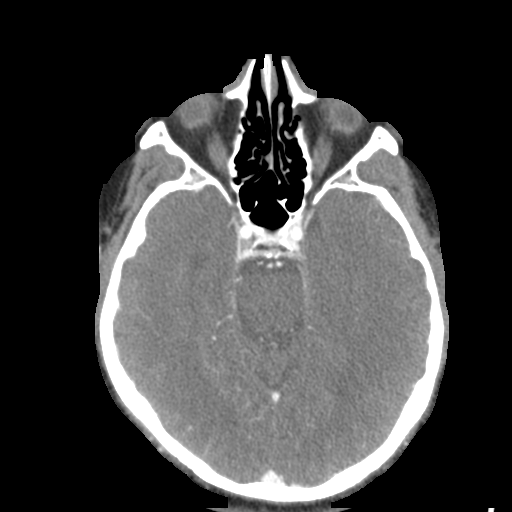

[Series 7: ax thin · axial · 0.34mm/px · z∈[-346,-111]mm · 6 of 330 slices shown]
[im 48/330  soft-tissue]
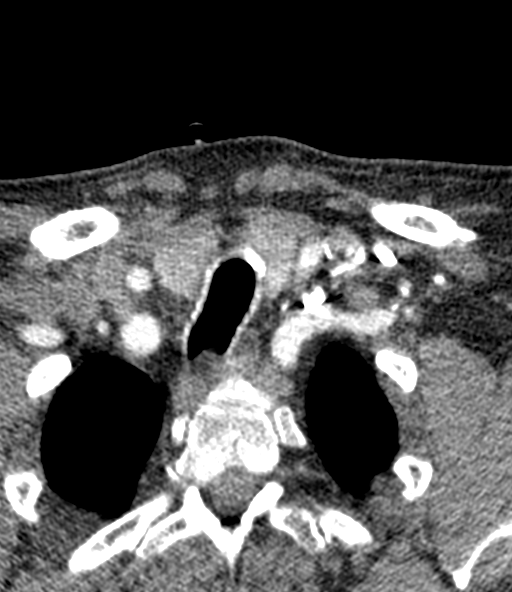
[im 95/330  bone]
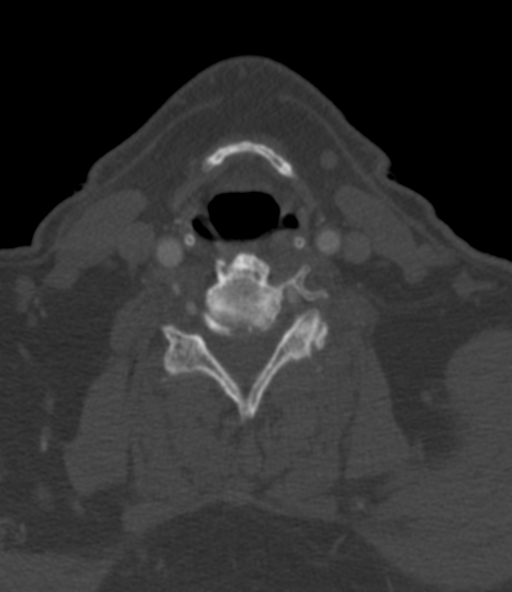
[im 142/330  soft-tissue]
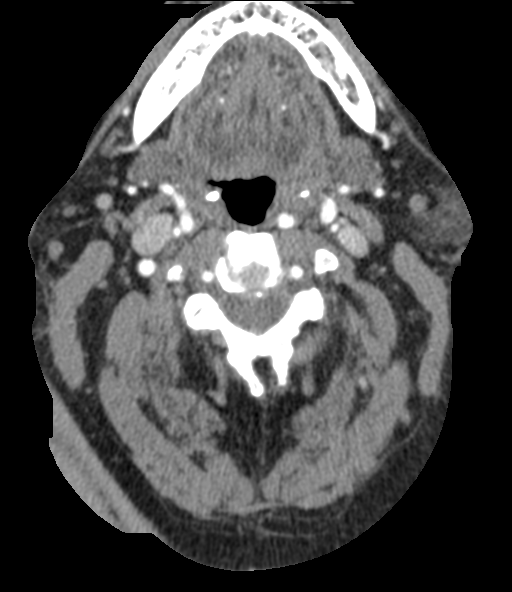
[im 189/330  bone]
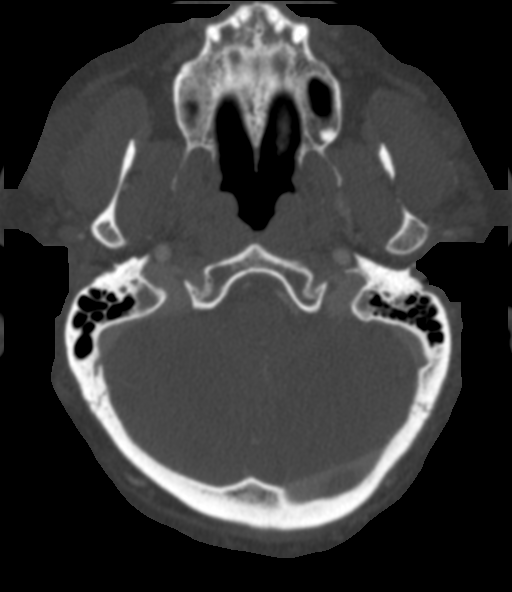
[im 236/330  soft-tissue]
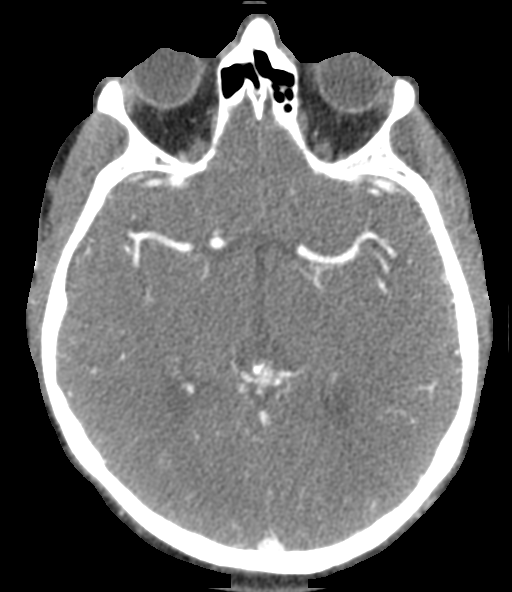
[im 283/330  bone]
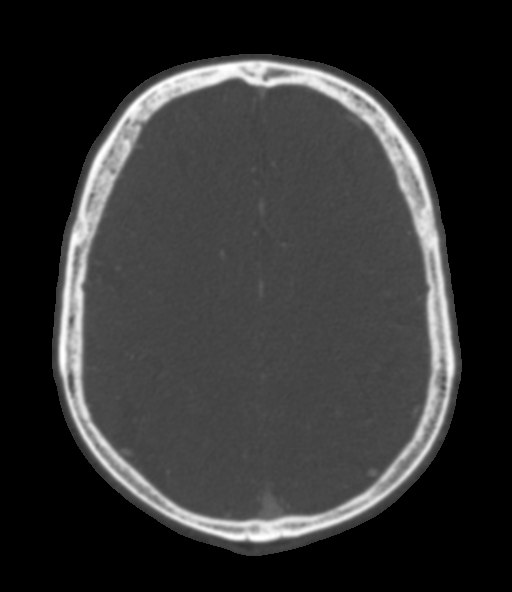

[8 of 33 positions shown; findings below may reference images not displayed]

RADIATION DOSE REDUCTION: This exam was performed according to the
departmental dose-optimization program which includes automated
exposure control, adjustment of the mA and/or kV according to
patient size and/or use of iterative reconstruction technique.

CONTRAST:  75mL OMNIPAQUE IOHEXOL 350 MG/ML SOLN
FINDINGS: CTA NECK FINDINGS

Aortic arch: Aortic arch is normal.  Branching pattern is normal.

Right carotid system: Common carotid artery widely patent to the
bifurcation. Calcified plaque at the carotid bifurcation and ICA
bulb. Minimal diameter of the ICA bulb measures 4.3 mm. Compared to
a more distal cervical ICA diameter of 5.2 mm, this indicates a 20%
stenosis. Cervical ICA widely patent beyond that.

Left carotid system: Common carotid artery widely patent to the
bifurcation. Calcified plaque at the bifurcation and ICA bulb.
Minimal diameter in the ICA bulb measures 4.1 mm. Compared to a more
distal cervical ICA diameter of the same, there is no stenosis.

Vertebral arteries: Both vertebral artery origins are widely patent.
Both vertebral arteries appear normal through the cervical region to
the foramen magnum.

Skeleton: Ordinary degenerative spondylosis.

Other neck: No mass or lymphadenopathy.

Upper chest: Lung apices are clear.

Review of the MIP images confirms the above findings

CTA HEAD FINDINGS

Anterior circulation: Both internal carotid arteries are patent
through the skull base and siphon regions. Minimal siphon
atherosclerotic calcification but no stenosis. The anterior and
middle cerebral vessels are patent. No proximal stenosis, aneurysm
or vascular malformation. Right PCA does receives primary supply
from the anterior circulation, but there is no evidence of aneurysm.

Posterior circulation: Both vertebral arteries are patent through
the foramen magnum to the basilar. No basilar stenosis. Posterior
circulation branch vessels are normal. Right PCA takes fetal origin
as noted above.

Venous sinuses: Patent and normal.

Anatomic variants: None significant otherwise.

Review of the MIP images confirms the above findings
IMPRESSION: No abnormality seen to explain the clinical presentation. Mild
atherosclerotic disease at both carotid bifurcations. 20% stenosis
of the proximal ICA on the right. No stenosis on the left. No
evidence of aneurysm to explain right sixth nerve palsy.

## 2022-10-05 IMAGING — MR MR HEAD W/O CM
10 series · 48 of 48 positions shown · non-contrast
Comparison: Same day CT/CTA.

CLINICAL DATA: Neuro deficit, acute, stroke suspected

EXAM:
MRI HEAD WITHOUT CONTRAST
TECHNIQUE: Multiplanar, multiecho pulse sequences of the brain and surrounding
structures were obtained without intravenous contrast.

[Series 6: DWI · axial · 3.0mm · 1.36mm/px · z∈[-2,+117]mm · 11 of 96 slices shown (1 of 4)]
[im 1/96]
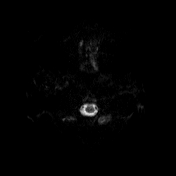
[im 10/96]
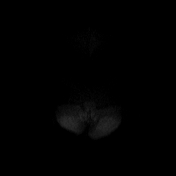
[im 20/96]
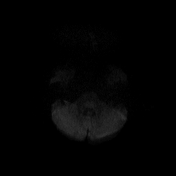
[im 29/96]
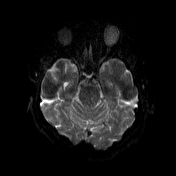
[im 39/96]
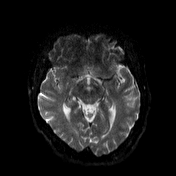
[im 48/96]
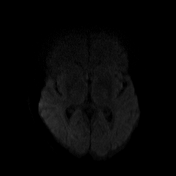
[im 58/96]
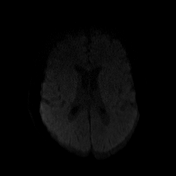
[im 67/96]
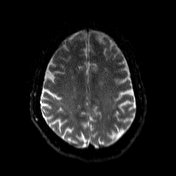
[im 77/96]
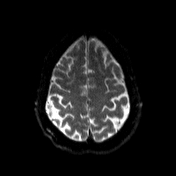
[im 86/96]
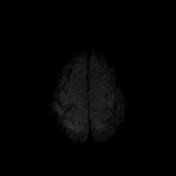
[im 96/96]
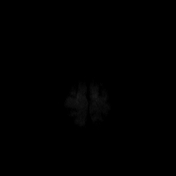

[Series 7: DWI · axial · 3.0mm · 1.36mm/px · z∈[-2,+117]mm · 5 of 48 slices shown (2 of 4)]
[im 1/48]
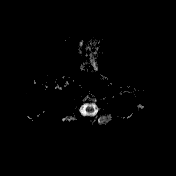
[im 12/48]
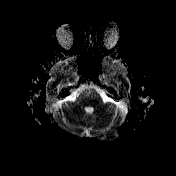
[im 24/48]
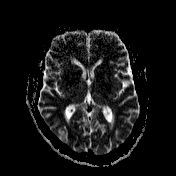
[im 36/48]
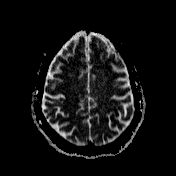
[im 48/48]
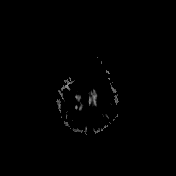

[Series 8: T2 · sagittal · 5.0mm · 0.47mm/px · 2 of 24 slices shown (1 of 3)]
[im 1/24]
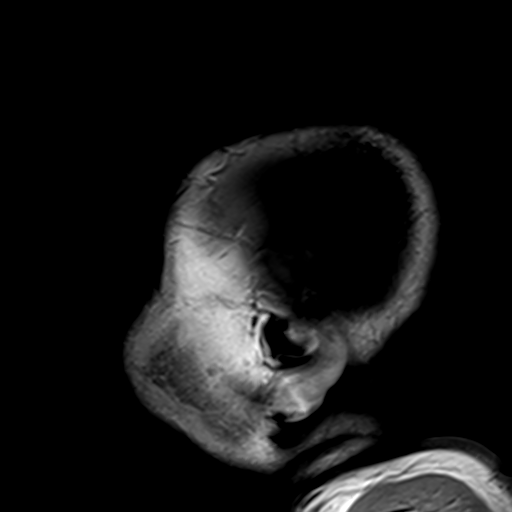
[im 24/24]
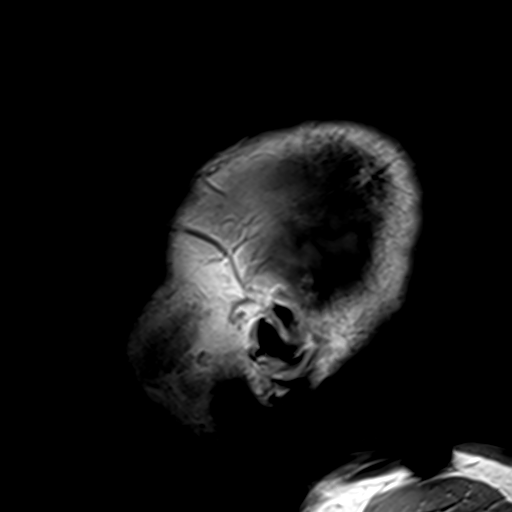

[Series 9: T2 · axial · 5.0mm · 0.45mm/px · z∈[-23,+114]mm · 3 of 25 slices shown (2 of 3)]
[im 1/25]
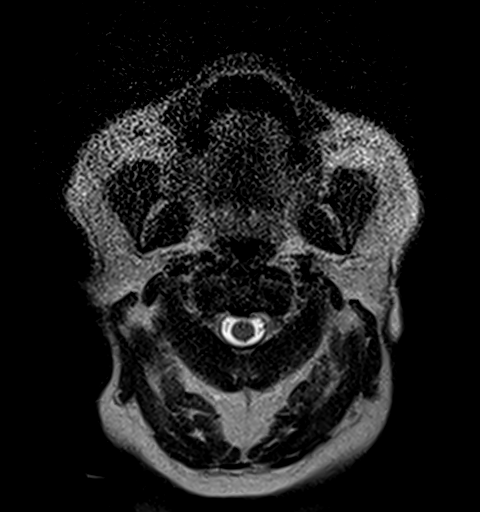
[im 13/25]
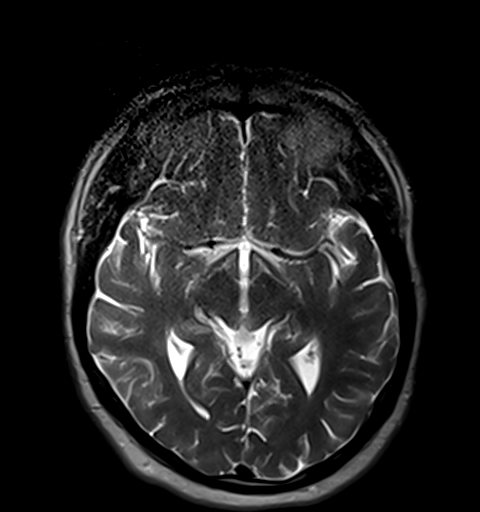
[im 25/25]
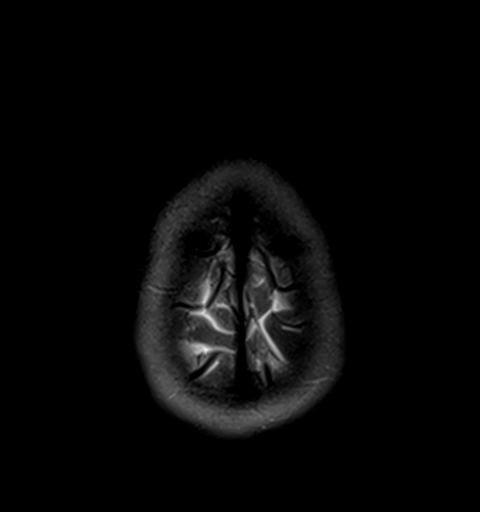

[Series 10: GRE · axial · 3.0mm · 0.45mm/px · z∈[-19,+112]mm · 5 of 51 slices shown]
[im 1/51]
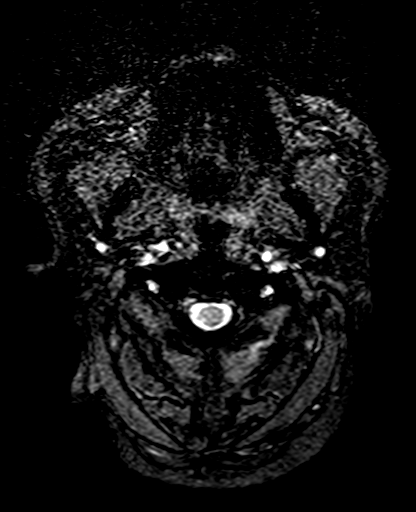
[im 13/51]
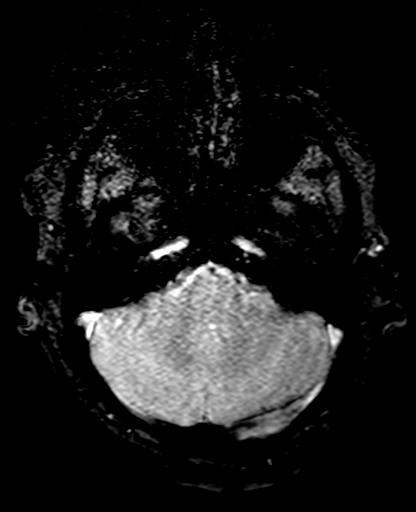
[im 26/51]
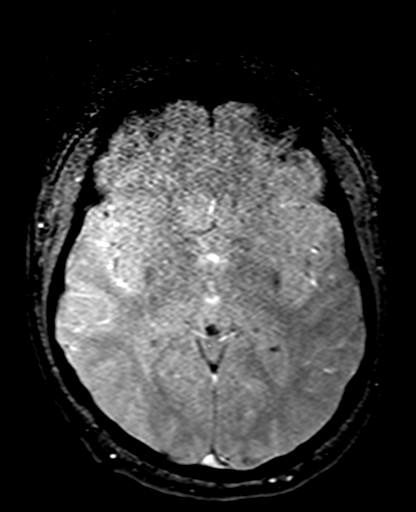
[im 38/51]
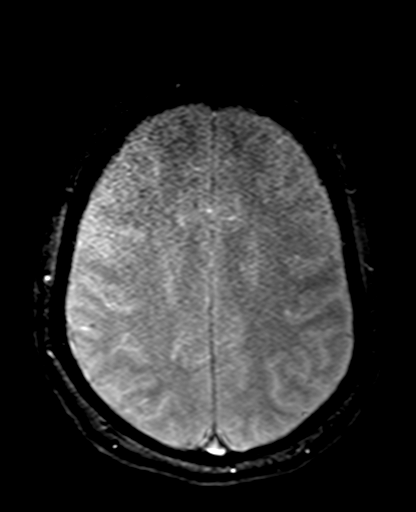
[im 51/51]
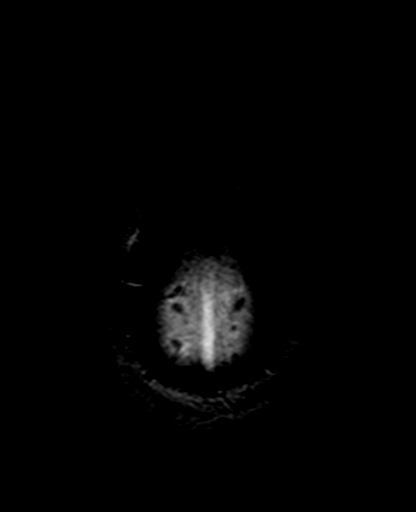

[Series 11: FLAIR · axial · 3.0mm · 0.86mm/px · z∈[-23,+108]mm · 5 of 51 slices shown]
[im 1/51]
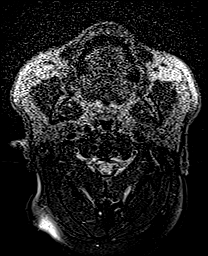
[im 13/51]
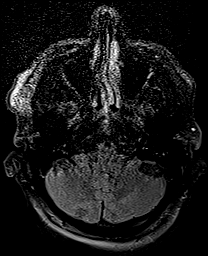
[im 26/51]
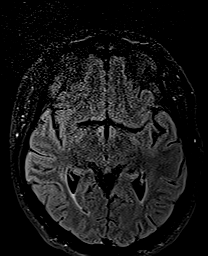
[im 38/51]
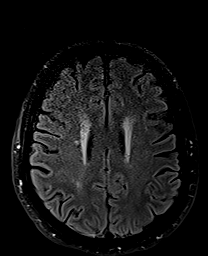
[im 51/51]
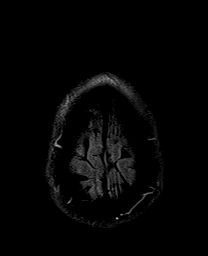

[Series 12: T1 · axial · 3.0mm · 0.45mm/px · z∈[-12,+116]mm · 5 of 51 slices shown]
[im 1/51]
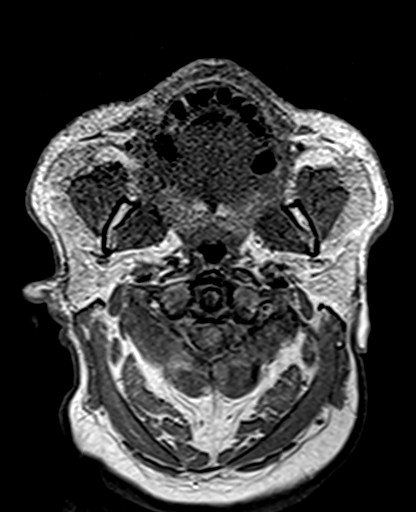
[im 13/51]
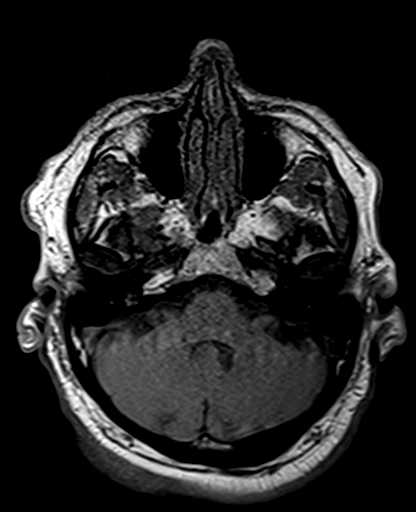
[im 26/51]
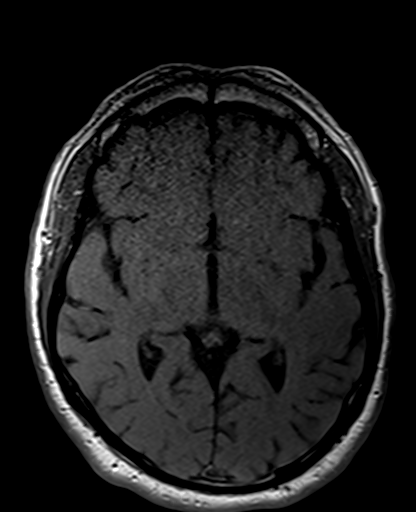
[im 38/51]
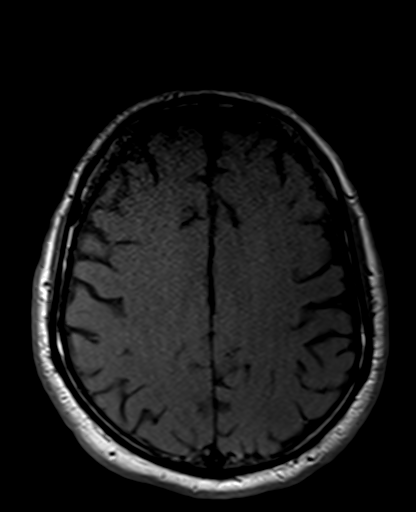
[im 51/51]
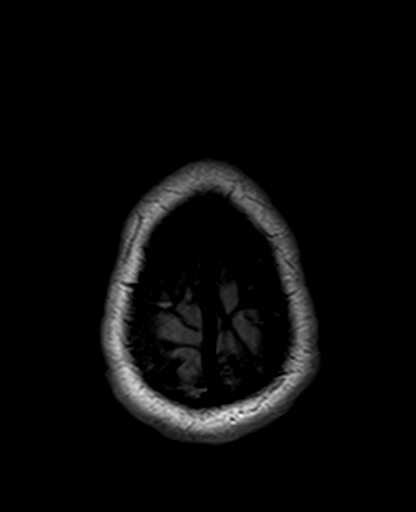

[Series 13: DWI · coronal · 5.0mm · 1.31mm/px · 6 of 60 slices shown (3 of 4)]
[im 1/60]
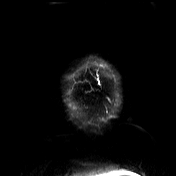
[im 12/60]
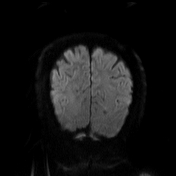
[im 24/60]
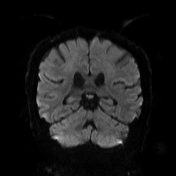
[im 36/60]
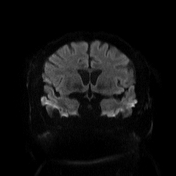
[im 48/60]
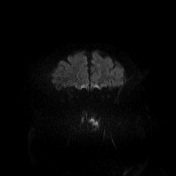
[im 60/60]
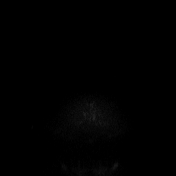

[Series 14: DWI · coronal · 5.0mm · 1.31mm/px · 3 of 30 slices shown (4 of 4)]
[im 1/30]
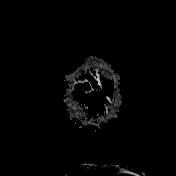
[im 15/30]
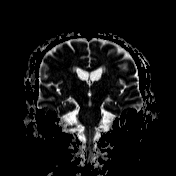
[im 30/30]
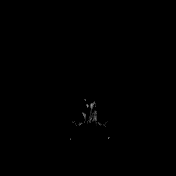

[Series 15: T2 · coronal · 5.0mm · 0.86mm/px · 3 of 30 slices shown (3 of 3)]
[im 1/30]
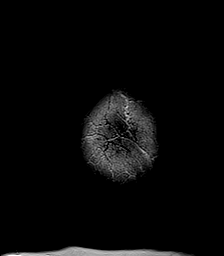
[im 15/30]
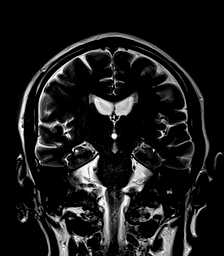
[im 30/30]
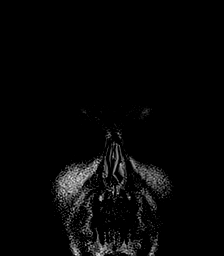

[48 of 48 positions shown; findings below may reference images not displayed]

FINDINGS: Motion limited study.  Within this limitation:

Brain: No evidence of acute infarction, acute hemorrhage,
hydrocephalus, extra-axial collection or mass lesion. Mild
predominantly periventricular T2/FLAIR hyperintensities.

Vascular: Major arterial flow voids are maintained skull base.
Further evaluated on same day CTA.

Skull and upper cervical spine: Diffuse T1 hypointensity of the bone
marrow. No focal marrow replacing lesion.

Sinuses/Orbits: Clear sinuses.  Unremarkable orbits.

Other: No mastoid effusions.
IMPRESSION: 1. No evidence of acute intracranial abnormality on this motion
limited study.
2. Mild predominantly periventricular T2/FLAIR hyperintensities,
which are nonspecific. Primary differential considerations include
chronic microvascular ischemic disease and chronic demyelination.
3. Diffuse T1 hypointensity of the bone marrow, which is nonspecific
but most commonly secondary to chronic anemia, chronic hypoxia (such
as in smokers), and/or obesity. Underlying lymphoproliferative
disorder is a less likely differential consideration.

## 2023-04-28 ENCOUNTER — Encounter: Payer: Self-pay | Admitting: Physician Assistant

## 2023-04-28 ENCOUNTER — Ambulatory Visit: Payer: Medicare Other | Admitting: Physician Assistant

## 2023-04-28 VITALS — BP 118/62 | HR 66 | Ht 70.0 in | Wt 256.0 lb

## 2023-04-28 DIAGNOSIS — R194 Change in bowel habit: Secondary | ICD-10-CM

## 2023-04-28 DIAGNOSIS — K625 Hemorrhage of anus and rectum: Secondary | ICD-10-CM

## 2023-04-28 MED ORDER — NA SULFATE-K SULFATE-MG SULF 17.5-3.13-1.6 GM/177ML PO SOLN: 1.0000 | Freq: Once | ORAL | 0 refills | Status: AC

## 2023-04-28 NOTE — Progress Notes (Signed)
Chief Complaint: Change in bowel habits and rectal bleeding  HPI:    Joseph Mccall is a 72 year old male with a past medical history as listed below including anxiety, depression, GERD and multiple others, known to Dr. Russella Dar, who was referred to me by Adrian Prince, MD for a complaint of change in bowel habits and rectal bleeding.    01/04/2019 colonoscopy with two 7-8 mm polyps in the descending colon, 2 nonbleeding colonic angiodysplastic lesions, mild diverticulosis in the left colon, internal hemorrhoids and otherwise normal.  Repeat recommended in 5 years given history of adenomatous polyps.  Pathology showed tubular adenomas.    02/14/2022 echo with normal LVEF at 60-65%.    Today, the patient tells me that over the past 2 to 3 months he has had "chronic diarrhea".  Tells me that over that timeframe he has only had "a handful of regular bowel movements", tells me that 2-3 times a day he will have some liquid or very loose stools.  Denies any changes in his medication or diet over that timeframe, no increase in stress or anxiety.  Does have history of Dicyclomine use for occasional abdominal pain but actually ran out of this about a year ago, prior to that just using it as needed.  Also discusses seeing some bright red blood on the toilet paper when he wipes occasionally which she blames on hemorrhoids but "I am not sure".  Apparently started Restorative and tried Imodium which stopped helping.  Denies any real abdominal pain.    Tells me his PCP did stool studies and they were all negative.    Denies fever, chills, weight loss, nausea, vomiting, heartburn or reflux or symptoms that awaken him from sleep.  Past Medical History:  Diagnosis Date   Abnormal transaminases    Anxiety and depression    Arthralgia    Arthritis    knees   Bilateral inguinal hernia 06/03/2018   Cataract    removed both eyes   Chest pain    Diastolic dysfunction 12/12/2015   Moderate, Noted on ECHO   Diverticulosis  01/04/2019   Mild, left colon   Fatty liver 12/07/2018   Mild, Noted on CT Abd   GERD (gastroesophageal reflux disease)    Glucose intolerance (impaired glucose tolerance)    History of colonic polyps    Hyperlipidemia    Hypothyroidism    No medication   Insomnia    Irregular heart beat    reduced caffefine intake has helped   Knee pain, bilateral    LVH (left ventricular hypertrophy) 12/12/2015   Mild, noted ECHO    Malaise and fatigue    Obesity    Sleep apnea    no cpap-last sleep test di dnot indicate sleep apnea   Tinnitus    Umbilical hernia 06/03/2018   Urinary hesitancy     Past Surgical History:  Procedure Laterality Date   CATARACT EXTRACTION, BILATERAL     CHOLECYSTECTOMY N/A 05/06/2019   Procedure: LAPAROSCOPIC CHOLECYSTECTOMY;  Surgeon: Abigail Miyamoto, MD;  Location: [facility];  Service: General;  Laterality: N/A;   COLON BIOPSY     colon polyp removal     COLONOSCOPY W/ POLYPECTOMY  01/04/2019   POLYPECTOMY     UPPER GI ENDOSCOPY  01/04/2019    Current Outpatient Medications  Medication Sig Dispense Refill   baclofen (LIORESAL) 10 MG tablet Take 10 mg by mouth daily as needed for muscle spasms.     Ciclopirox 1 % shampoo Apply 1 application  topically daily as needed (rash).      diazepam (VALIUM) 5 MG tablet Take 5 mg by mouth every 6 (six) hours as needed for anxiety.     dicyclomine (BENTYL) 20 MG tablet Take 1 tablet (20 mg total) by mouth 3 (three) times daily as needed for spasms. 90 tablet 11   fluticasone (CUTIVATE) 0.05 % cream Apply 1 application topically daily as needed (For rash on skin).     NEOMYCIN-POLYMYXIN-HYDROCORTISONE (CORTISPORIN) 1 % SOLN OTIC solution Apply 1-2 drops to toe BID after soaking 10 mL 1   pantoprazole (PROTONIX) 40 MG tablet Take 1 tablet (40 mg total) by mouth daily. MUST HAVE OFFICE VISIT FOR ADDITIONAL REFILLS 30 tablet 0   RESTORA RX 60-1.25 MG CAPS Take 1 capsule by mouth daily.     No current facility-administered  medications for this visit.    Allergies as of 04/28/2023 - Review Complete 04/28/2023  Allergen Reaction Noted   Naltrexone-bupropion hcl er  01/22/2022    Family History  Problem Relation Age of Onset   Lung disease Father        pulmonary fibrosis   Colon cancer Paternal Aunt    Stomach cancer Maternal Grandmother    Colon polyps Neg Hx    Esophageal cancer Neg Hx    Rectal cancer Neg Hx     Social History   Socioeconomic History   Marital status: Married    Spouse name: Not on file   Number of children: 0   Years of education: college   Highest education level: Not on file  Occupational History   Occupation: retired  Tobacco Use   Smoking status: Former    Packs/day: 1.00    Years: 10.00    Additional pack years: 0.00    Total pack years: 10.00    Types: Cigarettes    Quit date: 12/30/1988    Years since quitting: 34.3   Smokeless tobacco: Never  Vaping Use   Vaping Use: Never used  Substance and Sexual Activity   Alcohol use: Yes    Alcohol/week: 15.0 standard drinks of alcohol    Types: 15 Cans of beer per week    Comment: 15 beers per week    Drug use: No   Sexual activity: Not on file  Other Topics Concern   Not on file  Social History Narrative   Denies caffeine consumption.   Lives with wife.   Right-handed.   Social Determinants of Health   Financial Resource Strain: Not on file  Food Insecurity: Not on file  Transportation Needs: Not on file  Physical Activity: Not on file  Stress: Not on file  Social Connections: Not on file  Intimate Partner Violence: Not on file    Review of Systems:    Constitutional: No weight loss, fever or chills Skin: No rash  Cardiovascular: No chest pain Respiratory: No SOB  Gastrointestinal: See HPI and otherwise negative Genitourinary: No dysuria  Neurological: No headache, dizziness or syncope Musculoskeletal: No new muscle or joint pain Hematologic: No bruising Psychiatric: +anxiety    Physical  Exam:  Vital signs: BP 118/62   Pulse 66   Ht 5\' 10"  (1.778 m)   Wt 256 lb (116.1 kg)   BMI 36.73 kg/m   Constitutional:   Pleasant elderly Caucasian male appears to be in NAD, Well developed, Well nourished, alert and cooperative Head:  Normocephalic and atraumatic. Eyes:   PEERL, EOMI. No icterus. Conjunctiva pink. Ears:  Normal auditory acuity. Neck:  Supple Throat: Oral cavity and pharynx without inflammation, swelling or lesion.  Respiratory: Respirations even and unlabored. Lungs clear to auscultation bilaterally.   No wheezes, crackles, or rhonchi.  Cardiovascular: Normal S1, S2. No MRG. Regular rate and rhythm. No peripheral edema, cyanosis or pallor.  Gastrointestinal:  Soft, nondistended, nontender. No rebound or guarding.  Increased bowel sounds all 4 quadrants, No appreciable masses or hepatomegaly. Rectal:  Not performed.  Msk:  Symmetrical without gross deformities. Without edema, no deformity or joint abnormality.  Neurologic:  Alert and  oriented x4;  grossly normal neurologically.  Skin:   Dry and intact without significant lesions or rashes. Psychiatric: Demonstrates good judgement and reason without abnormal affect or behaviors.  RELEVANT LABS AND IMAGING: CBC    Component Value Date/Time   WBC 3.9 02/04/2022 1049   WBC 4.6 01/28/2022 1220   RBC 5.33 02/04/2022 1049   RBC 5.17 01/28/2022 1220   HGB 16.5 02/04/2022 1049   HCT 49.4 02/04/2022 1049   PLT 130 (L) 02/04/2022 1049   MCV 93 02/04/2022 1049   MCH 31.0 02/04/2022 1049   MCH 31.1 01/28/2022 1220   MCHC 33.4 02/04/2022 1049   MCHC 33.4 01/28/2022 1220   RDW 13.1 02/04/2022 1049   LYMPHSABS 1.4 02/04/2022 1049   MONOABS 0.4 01/28/2022 1220   EOSABS 0.2 02/04/2022 1049   BASOSABS 0.0 02/04/2022 1049    CMP     Component Value Date/Time   NA 141 01/28/2022 1231   K 4.1 01/28/2022 1231   CL 101 01/28/2022 1231   CO2 29 01/28/2022 1220   GLUCOSE 111 (H) 01/28/2022 1231   BUN 8 01/28/2022 1231    CREATININE 0.80 01/28/2022 1231   CALCIUM 9.0 01/28/2022 1220   PROT 7.1 01/28/2022 1220   ALBUMIN 3.9 01/28/2022 1220   AST 36 01/28/2022 1220   ALT 32 01/28/2022 1220   ALKPHOS 55 01/28/2022 1220   BILITOT 1.2 01/28/2022 1220   GFRNONAA >60 01/28/2022 1220    Assessment: 1.  Change in bowel habits: Towards looser stools, apparently PCP ran stool studies which were negative per patient, 2-3 loose bowel movements a day over the past 2 to 3 months; consider IBS versus other 2.  Rectal bleeding: History of AVMs and hemorrhoids on last colonoscopy; likely hemorrhoids  Plan: 1.  Requesting recent stool studies by PCP 2.  Patient really wanted to have a colonoscopy for further evaluation, apparently has been reading up and thinks this may be colon cancer.  Scheduled patient for diagnostic colonoscopy in the [facility] with Dr. Russella Dar.  Did provide the patient a detailed list of risks for the procedure and he agrees to proceed. Patient is appropriate for endoscopic procedure(s) in the ambulatory ([facility]) setting.  3.  Patient to follow in clinic per recommendations after time of colonoscopy.  Hyacinth Meeker, PA-C [facility] 04/28/2023, 1:46 PM  Cc: Adrian Prince, MD

## 2023-04-28 NOTE — Patient Instructions (Signed)
You have been scheduled for a colonoscopy. Please follow written instructions given to you at your visit today.  Please pick up your prep supplies at the pharmacy within the next 1-3 days. If you use inhalers (even only as needed), please bring them with you on the day of your procedure.  _______________________________________________________  If your blood pressure at your visit was 140/90 or greater, please contact your primary care physician to follow up on this.  _______________________________________________________  If you are age 70 or older, your body mass index should be between 23-30. Your Body mass index is 36.73 kg/m. If this is out of the aforementioned range listed, please consider follow up with your Primary Care Provider.  If you are age 93 or younger, your body mass index should be between 19-25. Your Body mass index is 36.73 kg/m. If this is out of the aformentioned range listed, please consider follow up with your Primary Care Provider.   ________________________________________________________  The [facility] providers would like to encourage you to use [facility] to communicate with providers for non-urgent requests or questions.  Due to long hold times on the telephone, sending your provider a message by [facility] may be a faster and more efficient way to get a response.  Please allow 48 business hours for a response.  Please remember that this is for non-urgent requests.  _______________________________________________________

## 2023-05-05 ENCOUNTER — Encounter: Payer: Self-pay | Admitting: Gastroenterology

## 2023-05-13 ENCOUNTER — Ambulatory Visit (AMBULATORY_SURGERY_CENTER): Payer: Medicare Other | Admitting: Gastroenterology

## 2023-05-13 ENCOUNTER — Encounter: Payer: Self-pay | Admitting: Gastroenterology

## 2023-05-13 VITALS — BP 140/68 | HR 76 | Temp 98.4°F | Resp 15 | Ht 70.0 in | Wt 256.0 lb

## 2023-05-13 DIAGNOSIS — D122 Benign neoplasm of ascending colon: Secondary | ICD-10-CM | POA: Diagnosis not present

## 2023-05-13 DIAGNOSIS — D125 Benign neoplasm of sigmoid colon: Secondary | ICD-10-CM | POA: Diagnosis not present

## 2023-05-13 DIAGNOSIS — D123 Benign neoplasm of transverse colon: Secondary | ICD-10-CM

## 2023-05-13 DIAGNOSIS — R194 Change in bowel habit: Secondary | ICD-10-CM

## 2023-05-13 DIAGNOSIS — K635 Polyp of colon: Secondary | ICD-10-CM | POA: Diagnosis not present

## 2023-05-13 DIAGNOSIS — Z09 Encounter for follow-up examination after completed treatment for conditions other than malignant neoplasm: Secondary | ICD-10-CM

## 2023-05-13 DIAGNOSIS — Z86010 Personal history of colonic polyps: Secondary | ICD-10-CM

## 2023-05-13 DIAGNOSIS — K921 Melena: Secondary | ICD-10-CM

## 2023-05-13 MED ORDER — SODIUM CHLORIDE 0.9 % IV SOLN: 500.0000 mL | Freq: Once | INTRAVENOUS | Status: DC

## 2023-05-13 NOTE — Progress Notes (Signed)
Uneventful anesthetic. Report to pacu rn. Vss. Care resumed by rn. 

## 2023-05-13 NOTE — Patient Instructions (Signed)
Please read handouts provided. Continue present medications. Await pathology results.   YOU HAD AN ENDOSCOPIC PROCEDURE TODAY AT THE [facility]:   Refer to the procedure report that was given to you for any specific questions about what was found during the examination.  If the procedure report does not answer your questions, please call your gastroenterologist to clarify.  If you requested that your care partner not be given the details of your procedure findings, then the procedure report has been included in a sealed envelope for you to review at your convenience later.  YOU SHOULD EXPECT: Some feelings of bloating in the abdomen. Passage of more gas than usual.  Walking can help get rid of the air that was put into your GI tract during the procedure and reduce the bloating. If you had a lower endoscopy (such as a colonoscopy or flexible sigmoidoscopy) you may notice spotting of blood in your stool or on the toilet paper. If you underwent a bowel prep for your procedure, you may not have a normal bowel movement for a few days.  Please Note:  You might notice some irritation and congestion in your nose or some drainage.  This is from the oxygen used during your procedure.  There is no need for concern and it should clear up in a day or so.  SYMPTOMS TO REPORT IMMEDIATELY:  Following lower endoscopy (colonoscopy or flexible sigmoidoscopy):  Excessive amounts of blood in the stool  Significant tenderness or worsening of abdominal pains  Swelling of the abdomen that is new, acute  Fever of 100F or higher  For urgent or emergent issues, a gastroenterologist can be reached at any hour by calling [phone]. Do not use MyChart messaging for urgent concerns.    DIET:  We do recommend a small meal at first, but then you may proceed to your regular diet.  Drink plenty of fluids but you should avoid alcoholic beverages for 24 hours.  ACTIVITY:  You should plan to take it easy for  the rest of today and you should NOT DRIVE or use heavy machinery until tomorrow (because of the sedation medicines used during the test).    FOLLOW UP: Our staff will call the number listed on your records the next business day following your procedure.  We will call around 7:15- 8:00 am to check on you and address any questions or concerns that you may have regarding the information given to you following your procedure. If we do not reach you, we will leave a message.     If any biopsies were taken you will be contacted by phone or by letter within the next 1-3 weeks.  Please call us at [phone] if you have not heard about the biopsies in 3 weeks.    SIGNATURES/CONFIDENTIALITY: You and/or your care partner have signed paperwork which will be entered into your electronic medical record.  These signatures attest to the fact that that the information above on your After Visit Summary has been reviewed and is understood.  Full responsibility of the confidentiality of this discharge information lies with you and/or your care-partner. 

## 2023-05-13 NOTE — Progress Notes (Signed)
Called to room to assist during endoscopic procedure.  Patient ID and intended procedure confirmed with present staff. Received instructions for my participation in the procedure from the performing physician.  

## 2023-05-13 NOTE — Op Note (Addendum)
[facility] Patient Name: Joseph Mccall Procedure Date: 05/13/2023 1:29 PM MRN: [id number] Endoscopist: Meryl Dare , MD, [phone] Age: 72 Referring MD:  Date of Birth: 04/29/1951 Gender: Male Account #: [id number] Procedure:                Colonoscopy Indications:              Hematochezia, Change in bowel habits, Personal                            history of adenomatous colon polyps Medicines:                Monitored Anesthesia Care Procedure:                Pre-Anesthesia Assessment:                           - Prior to the procedure, a History and Physical                            was performed, and patient medications and                            allergies were reviewed. The patient's tolerance of                            previous anesthesia was also reviewed. The risks                            and benefits of the procedure and the sedation                            options and risks were discussed with the patient.                            All questions were answered, and informed consent                            was obtained. Prior Anticoagulants: The patient has                            taken no anticoagulant or antiplatelet agents. ASA                            Grade Assessment: III - A patient with severe                            systemic disease. After reviewing the risks and                            benefits, the patient was deemed in satisfactory                            condition to undergo the procedure.  After obtaining informed consent, the colonoscope                            was passed under direct vision. Throughout the                            procedure, the patient's blood pressure, pulse, and                            oxygen saturations were monitored continuously. The                            Olympus CF-[id number] SN [id number] was introduced through                            the anus and  advanced to the the cecum, identified                            by appendiceal orifice and ileocecal valve. The                            ileocecal valve, appendiceal orifice, and rectum                            were photographed. The quality of the bowel                            preparation was adequate. The colonoscopy was                            performed without difficulty. The patient tolerated                            the procedure well. Scope In: 1:34:47 PM Scope Out: 1:50:10 PM Scope Withdrawal Time: 0 hours 12 minutes 42 seconds  Total Procedure Duration: 0 hours 15 minutes 23 seconds  Findings:                 The perianal and digital rectal examinations were                            normal.                           A 7 mm polyp was found in the sigmoid colon. The                            polyp was sessile. The polyp was removed with a                            cold snare. Resection and retrieval were complete.                           Two sessile polyps were found in the transverse  colon and ascending colon. The polyps were 3 mm in                            size. These polyps were removed with a cold biopsy                            forceps. Resection and retrieval were complete.                           Multiple small-mouthed diverticula were found in                            the left colon.                           Internal hemorrhoids were found during                            retroflexion. The hemorrhoids were moderate and                            Grade I (internal hemorrhoids that do not prolapse).                           The exam was otherwise without abnormality on                            direct and retroflexion views. Complications:            No immediate complications. Estimated blood loss:                            None. Estimated Blood Loss:     Estimated blood loss: none. Impression:               - One  7 mm polyp in the sigmoid colon, removed with                            a cold snare. Resected and retrieved.                           - Two 3 mm polyps in the transverse colon and in                            the ascending colon, removed with cold forceps.                            Resected and retrieved.                           - Mild diverticulosis in the left colon.                           - Internal hemorrhoids.                           -  The examination was otherwise normal on direct                            and retroflexion views. Recommendation:           - Repeat colonoscopy after studies are complete for                            surveillance based on pathology results.                           - Patient has a contact number available for                            emergencies. The signs and symptoms of potential                            delayed complications were discussed with the                            patient. Return to normal activities tomorrow.                            Written discharge instructions were provided to the                            patient.                           - Resume previous diet.                           - Continue present medications.                           - Await pathology results. Meryl Dare, MD 05/13/2023 1:55:12 PM This report has been signed electronically.

## 2023-05-13 NOTE — Progress Notes (Signed)
See 04/28/2023 H&P, no changes

## 2023-05-14 ENCOUNTER — Telehealth: Payer: Self-pay | Admitting: *Deleted

## 2023-05-14 NOTE — Telephone Encounter (Signed)
Left message on f/u call 

## 2023-05-29 ENCOUNTER — Encounter: Payer: Self-pay | Admitting: Gastroenterology

## 2024-10-18 ENCOUNTER — Ambulatory Visit

## 2024-10-18 DIAGNOSIS — L409 Psoriasis, unspecified: Secondary | ICD-10-CM

## 2024-10-18 MED ORDER — CLOBETASOL PROPIONATE 0.05 % EX OINT: TOPICAL_OINTMENT | CUTANEOUS | 5 refills | Status: AC

## 2024-10-18 MED ORDER — VTAMA 1 % EX CREA: TOPICAL_CREAM | CUTANEOUS | 5 refills | Status: AC

## 2024-10-18 NOTE — Patient Instructions (Addendum)
 (Vtama 1% cream)  prescription was sent to [facility] in [place]. A representative from [facility] will contact you within 3 business hours to verify your address and insurance information to schedule a free delivery. If for any reason you do not receive a phone call from them, please reach out to them. Their phone number is [phone] and their hours are Monday-Friday 9:00 am-5:00 pm.     Due to recent changes in healthcare laws, you may see results of your pathology and/or laboratory studies on MyChart before the doctors have had a chance to review them. We understand that in some cases there may be results that are confusing or concerning to you. Please understand that not all results are received at the same time and often the doctors may need to interpret multiple results in order to provide you with the best plan of care or course of treatment. Therefore, we ask that you please give us  2 business days to thoroughly review all your results before contacting the office for clarification. Should we see a critical lab result, you will be contacted sooner.   If You Need Anything After Your Visit  If you have any questions or concerns for your doctor, please call our main line at [phone] and press option 4 to reach your doctor's medical assistant. If no one answers, please leave a voicemail as directed and we will return your call as soon as possible. Messages left after 4 pm will be answered the following business day.   You may also send us  a message via MyChart. We typically respond to MyChart messages within 1-2 business days.  For prescription refills, please ask your pharmacy to contact our office. Our fax number is [phone].  If you have an urgent issue when the clinic is closed that cannot wait until the next business day, you can page your doctor at the number below.    Please note that while we do our best to be available for urgent issues outside of office hours,  we are not available 24/7.   If you have an urgent issue and are unable to reach us , you may choose to seek medical care at your doctor's office, retail clinic, urgent care center, or emergency room.  If you have a medical emergency, please immediately call 911 or go to the emergency department.  Pager Numbers  - Dr. Hester: [phone]  - Dr. Jackquline: [phone]  - Dr. Claudene: [phone]   In the event of inclement weather, please call our main line at [phone] for an update on the status of any delays or closures.  Dermatology Medication Tips: Please keep the boxes that topical medications come in in order to help keep track of the instructions about where and how to use these. Pharmacies typically print the medication instructions only on the boxes and not directly on the medication tubes.   If your medication is too expensive, please contact our office at [phone] option 4 or send us  a message through MyChart.   We are unable to tell what your co-pay for medications will be in advance as this is different depending on your insurance coverage. However, we may be able to find a substitute medication at lower cost or fill out paperwork to get insurance to cover a needed medication.   If a prior authorization is required to get your medication covered by your insurance company, please allow us  1-2 business days to complete this process.  Drug prices often vary depending on where  the prescription is filled and some pharmacies may offer cheaper prices.  The website www.goodrx.com contains coupons for medications through different pharmacies. The prices here do not account for what the cost may be with help from insurance (it may be cheaper with your insurance), but the website can give you the price if you did not use any insurance.  - You can print the associated coupon and take it with your prescription to the pharmacy.  - You may also stop by our office during regular  business hours and pick up a GoodRx coupon card.  - If you need your prescription sent electronically to a different pharmacy, notify our office through [facility] or by phone at [phone] option 4.     Si Usted Necesita Algo Despus de Su Visita  Tambin puede enviarnos un mensaje a travs de Clinical cytogeneticist. Por lo general respondemos a los mensajes de MyChart en el transcurso de 1 a 2 das hbiles.  Para renovar recetas, por favor pida a su farmacia que se ponga en contacto con nuestra oficina. Randi lakes de fax es [place] [phone].  Si tiene un asunto urgente cuando la clnica est cerrada y que no puede esperar hasta el siguiente da hbil, puede llamar/localizar a su doctor(a) al nmero que aparece a continuacin.   Por favor, tenga en cuenta que aunque hacemos todo lo posible para estar disponibles para asuntos urgentes fuera del horario de [place], no estamos disponibles las 24 horas del da, los 7 [address]  [address] de la [place].   Si tiene un problema urgente y no puede comunicarse con nosotros, puede optar por buscar atencin mdica  en el consultorio de su doctor(a), en una clnica privada, en un centro de atencin urgente o en una sala de emergencias.  Si tiene Engineer, drilling, por favor llame inmediatamente al 911 o vaya a la sala de emergencias.  Nmeros de bper  - Dr. Hester: [phone]  - Dra. Jackquline: [phone]  - Dr. Claudene: [phone]   En caso de inclemencias del tiempo, por favor llame a landry capes principal al [phone] para una actualizacin sobre el [place] de cualquier retraso o cierre.  Consejos para la medicacin en dermatologa: Por favor, guarde las cajas en las que vienen los medicamentos de uso tpico para ayudarle a seguir las instrucciones sobre dnde y cmo usarlos. Las farmacias generalmente imprimen las instrucciones del medicamento slo en las cajas y no directamente en los tubos del [place].   Si su medicamento es muy caro, por  favor, pngase en contacto con landry rieger llamando al [phone] y presione la opcin 4 o envenos un mensaje a travs de Clinical cytogeneticist.   No podemos decirle cul ser su copago por los medicamentos por adelantado ya que esto es diferente dependiendo de la cobertura de su seguro. Sin embargo, es posible que podamos encontrar un medicamento sustituto a Audiological scientist un formulario para que el seguro cubra el medicamento que se considera necesario.   Si se requiere una autorizacin previa para que su compaa de seguros malta su medicamento, por favor permtanos de 1 a 2 das hbiles para completar este proceso.  Los precios de los medicamentos varan con frecuencia dependiendo del Environmental consultant de dnde se surte la receta y alguna farmacias pueden ofrecer precios ms baratos.  El sitio web www.goodrx.com tiene cupones para medicamentos de Health and safety inspector. Los precios aqu no tienen en cuenta lo que podra costar con la ayuda del seguro (puede ser ms barato con su seguro), [employer]  sitio web puede darle el precio si no Visual merchandiser.  - Puede imprimir el cupn correspondiente y llevarlo con su receta a la farmacia.  - Tambin puede pasar por nuestra oficina durante el horario de atencin regular y Education officer, museum una tarjeta de cupones de GoodRx.  - Si necesita que su receta se enve electrnicamente a una farmacia diferente, informe a nuestra oficina a travs de MyChart de [place] o por telfono llamando al [phone] y presione la opcin 4.

## 2024-10-18 NOTE — Progress Notes (Signed)
    Subjective   Joseph Mccall is a 73 y.o. male who presents for the following: Psoriasis. Patient is new patient  Today patient reports: Patient dx with psoriasis x1 year ago at Dr. Henry in [place], patient states he was prescribed something but not currently using anything. Flares at arms and legs.   This patient is accompanied in the office by his spouse, Joseph Mccall.   Review of Systems:    No other skin or systemic complaints except as noted in HPI or Assessment and Plan.  The following portions of the chart were reviewed this encounter and updated as appropriate: medications, allergies, medical history  Relevant Medical History:  n/a   Objective  Well appearing patient in no apparent distress; mood and affect are within normal limits. Examination was performed of the: Focused Exam of: Arms, back, legs   Examination notable for: Psoriasis: Well circumscribed erythematous papules and plaques with overlying silvery scale on flexural surfaces including elbows, knees, gluteal cleft   Examination limited by: Undergarments, Shoes or socks , Clothing, and Patient deferred removal       Assessment & Plan    Psoriasis - mild, BSA 2%   Chronic and persistent condition with duration or expected duration over one year. Condition is symptomatic and bothersome to patient. Patient is flaring and not currently at treatment goal.  - Educated, discussed chronic nature and waxing/waning. - Discussed association with psoriatic arthritis, monitor for increasing joint pain/stiffness, red/hot swollen fingers or joints; discussed if develops joint involvement will need referral to Rheumatology and possible escalation of therapy - Discussed different treatments including topical corticosteroids, and/or topical vitamin D vs nbUVB vs systemic therapy (CsA, MTX, etanercept, adalimumab, ustekinumab, ixekizumab, apremilast) - Start  Clobetasol ointment 0.05% twice daily for 2 weeks to thick plaques until  skin flat and smooth. Can similarly retreat for flares. - Start tapinarof cream 1% twice daily, samples given to patient.  -Educated on AE's commonly seen in trials, consisting of headaches, folliculitis, contact dermatitis, respiratory infection, nasopharyngitis, pruritus, influenza     Level of service outlined above   Procedures, orders, diagnosis for this visit:    There are no diagnoses linked to this encounter.  Return to clinic: Return for 6-8 weeks psoriasis follow-up .  I, Jacquelynn V. Wilfred, CMA, am acting as scribe for Lauraine JAYSON Kanaris, MD .   Documentation: I have reviewed the above documentation for accuracy and completeness, and I agree with the above.  Lauraine JAYSON Kanaris, MD

## 2024-12-06 ENCOUNTER — Ambulatory Visit
